# Patient Record
Sex: Female | Born: 2000 | Race: Black or African American | Hispanic: No | Marital: Single | Smoking: Never smoker
Health system: Southern US, Community
[De-identification: ages and names within clinical notes are randomized; demographics above are authoritative.]

## PROBLEM LIST (undated history)

## (undated) ENCOUNTER — Inpatient Hospital Stay: Admission: RE | Payer: Self-pay | Source: Home / Self Care

## (undated) DIAGNOSIS — F1910 Other psychoactive substance abuse, uncomplicated: Secondary | ICD-10-CM

## (undated) DIAGNOSIS — J45909 Unspecified asthma, uncomplicated: Secondary | ICD-10-CM

## (undated) DIAGNOSIS — D571 Sickle-cell disease without crisis: Secondary | ICD-10-CM

## (undated) HISTORY — PX: NO PAST SURGERIES: SHX2092

---
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
## 2018-10-20 IMAGING — US US TRANSVAGINAL NON-OB
1 series · 13 of 25 positions shown · non-contrast
Comparison: None

CLINICAL DATA: 16-year-old female with pain for 2 days. Positive
chlamydia. LMP 06/14/2017.



[Series 1: us transvaginal non-ob · 0.20mm/px · 13 of 82 slices shown]
[im 1/82]
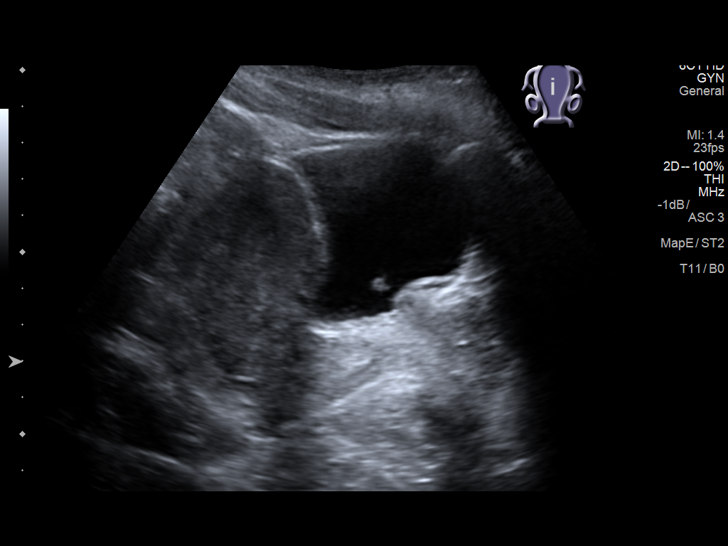
[im 7/82]
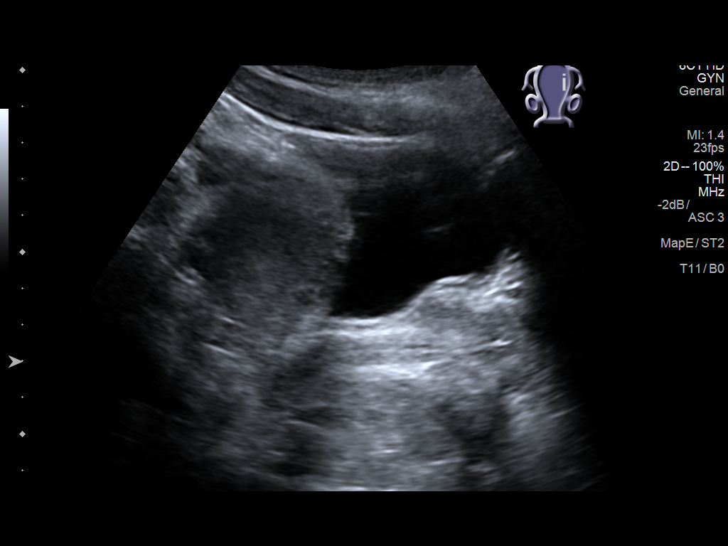
[im 14/82]
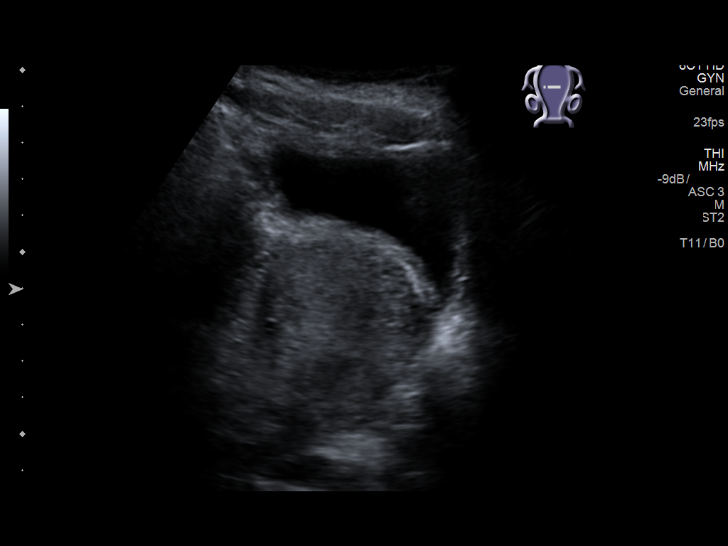
[im 21/82]
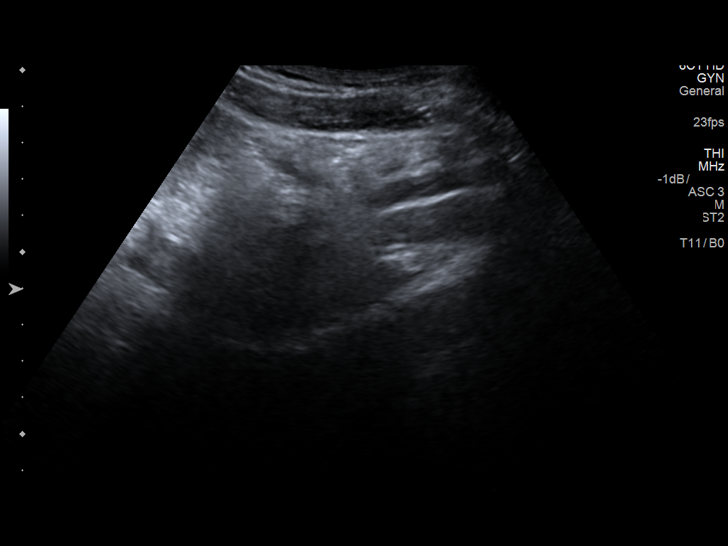
[im 28/82]
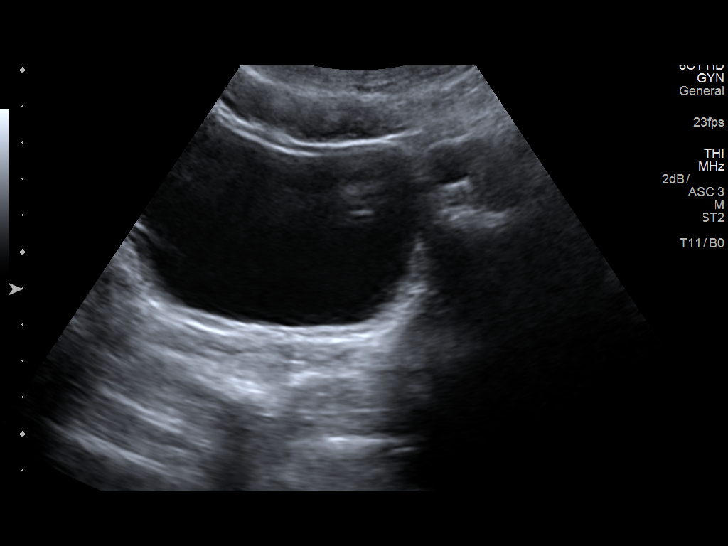
[im 34/82]
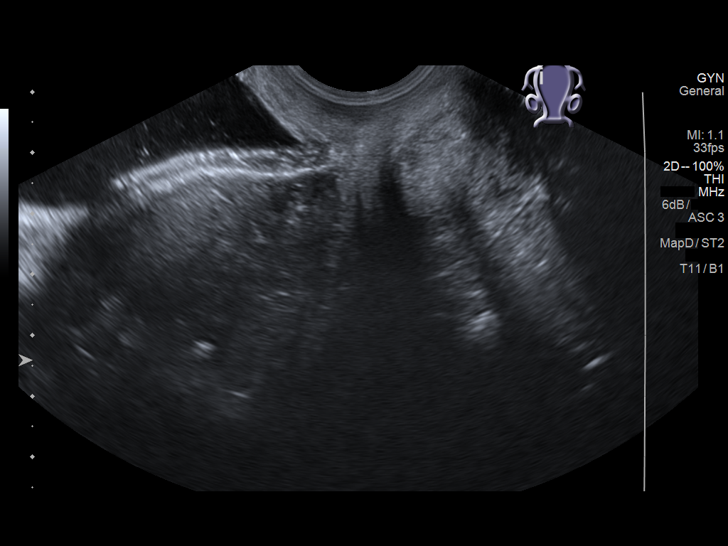
[im 41/82]
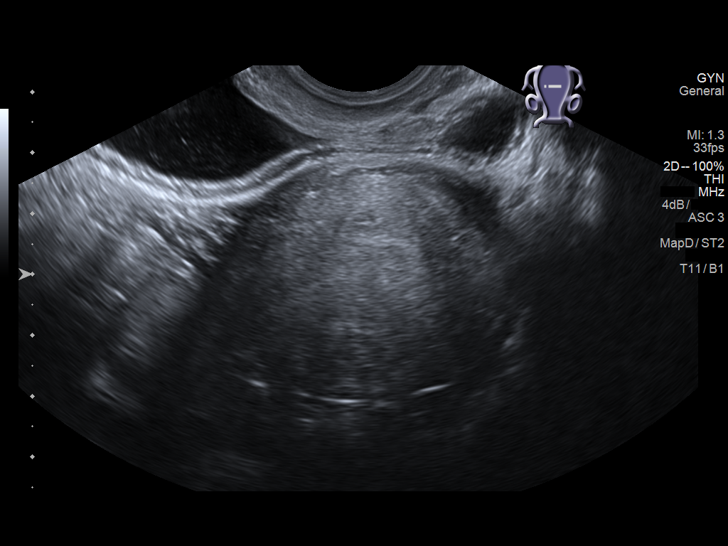
[im 48/82]
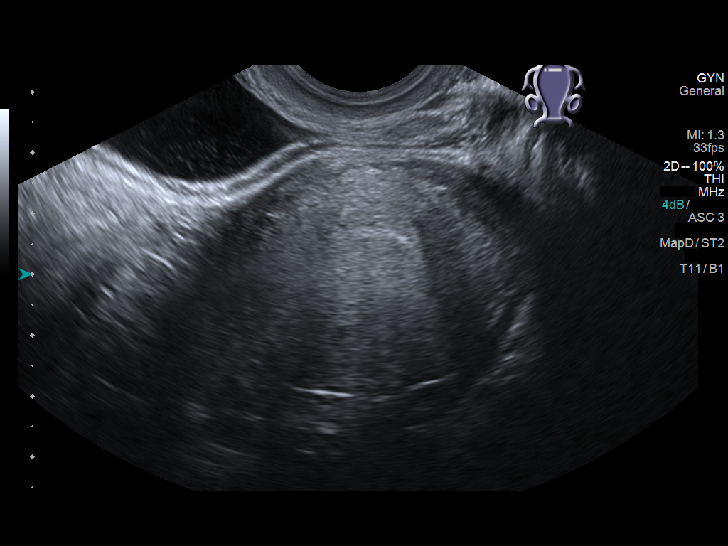
[im 55/82]
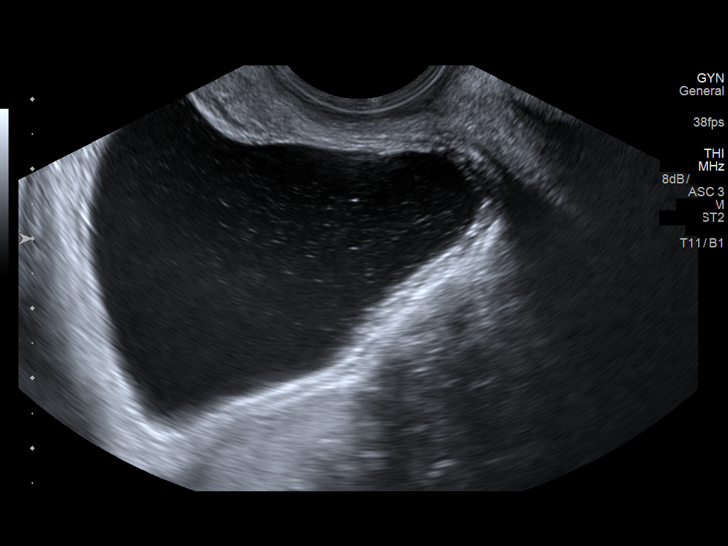
[im 61/82]
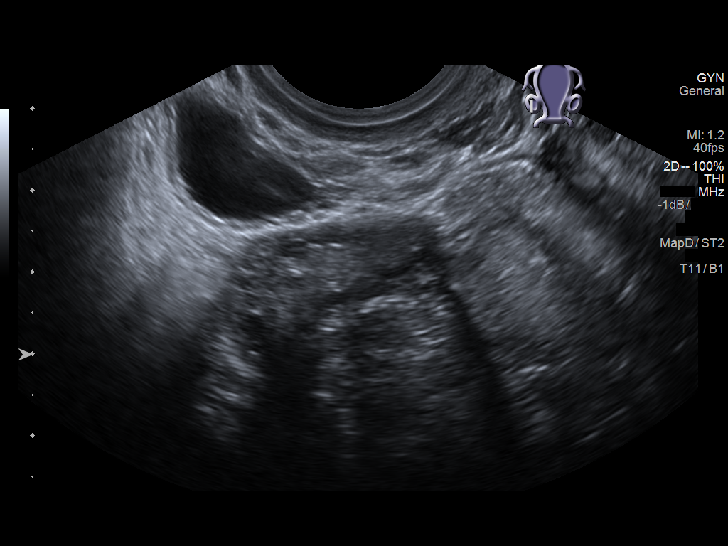
[im 68/82]
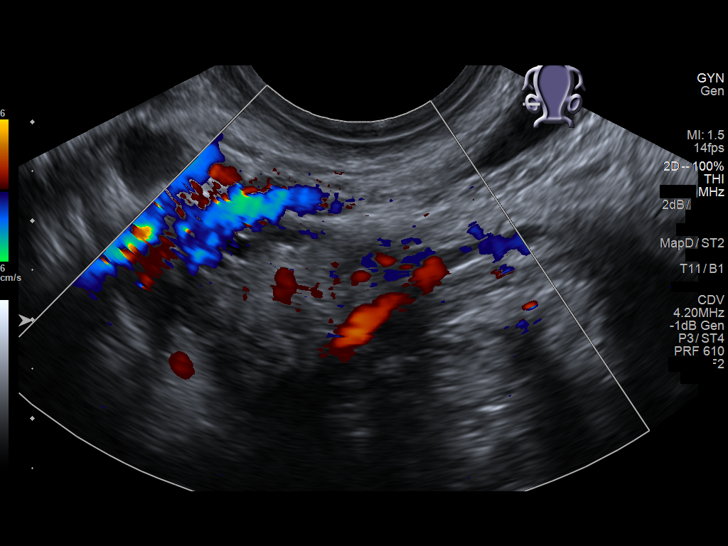
[im 75/82]
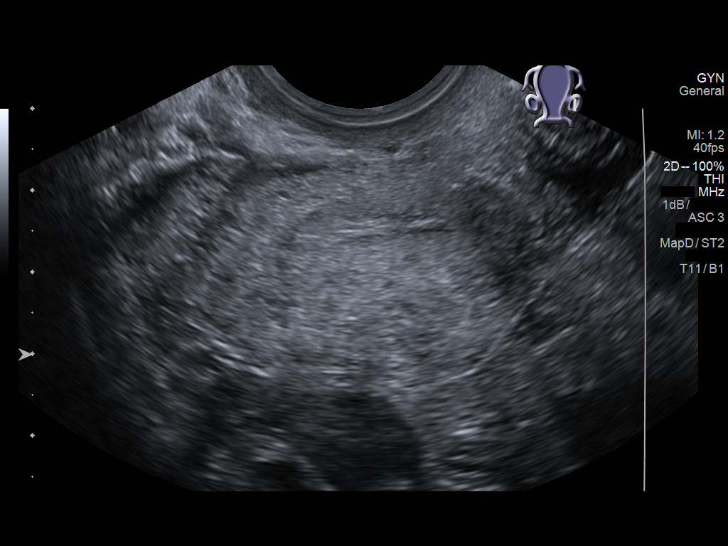
[im 82/82]
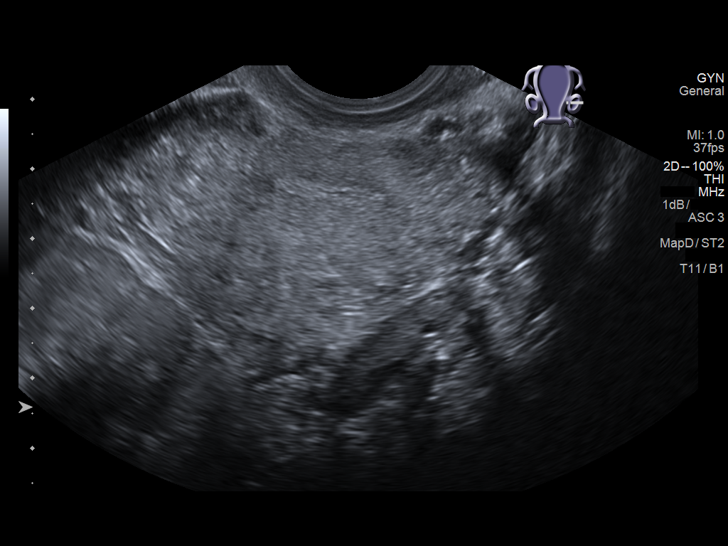

[13 of 25 positions shown; findings below may reference images not displayed]

FINDINGS: Uterus

Measurements: 7.6 x 3.8 x 5.0 centimeters. No fibroids or other mass
visualized.

Endometrium

Thickness: 7 millimeters.  No focal abnormality visualized.

Right ovary

Measurements: 2.9 x 1.8 x 2.3 centimeters. Normal appearance/no
adnexal mass.

Left ovary

Measurements: 2.7 x 1.2 x 1.6 centimeters. Normal appearance/no
adnexal mass.

Other findings

Echogenic urinary debris within the urinary bladder (image 66).

Trace simple appearing pelvic free fluid in the cul-de-sac (image
52).
IMPRESSION: 1. Negative for tubo-ovarian abscess.  Normal uterus and ovaries.
2. Echogenic debris within the urinary bladder.
3. Trace free fluid in the cul-de-sac is likely physiologic.

## 2018-12-13 IMAGING — US US PELVIS COMPLETE TRANSABD/TRANSVAG
1 series · 14 of 25 positions shown · non-contrast
Comparison: None

CLINICAL DATA: Initial evaluation for acute pelvic pain with
vaginal bleeding for 1 day.



[Series 1: us pelvis complete transabd/transvag · 0.17mm/px · 14 of 78 slices shown]
[im 1/78]
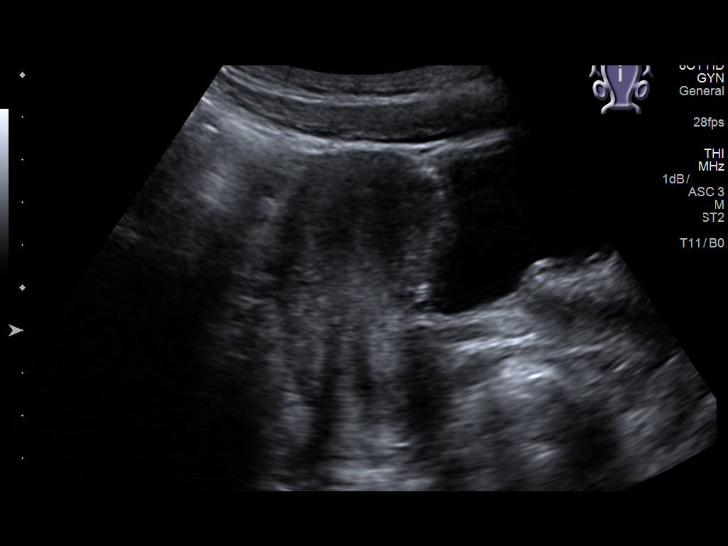
[im 7/78]
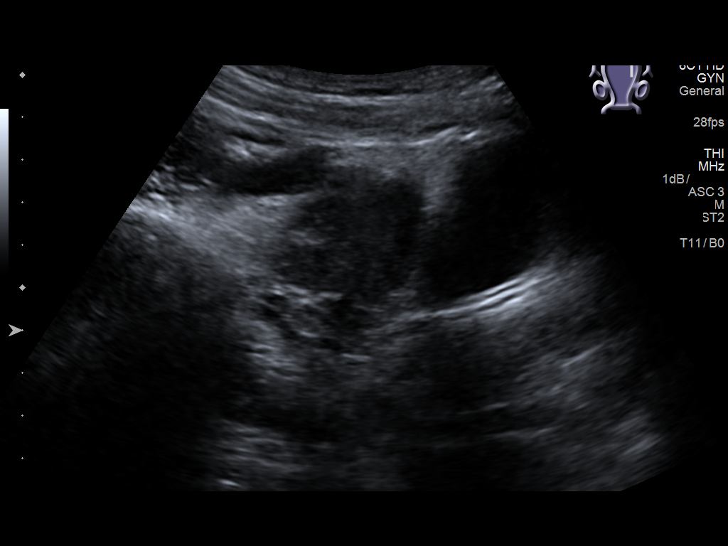
[im 13/78]
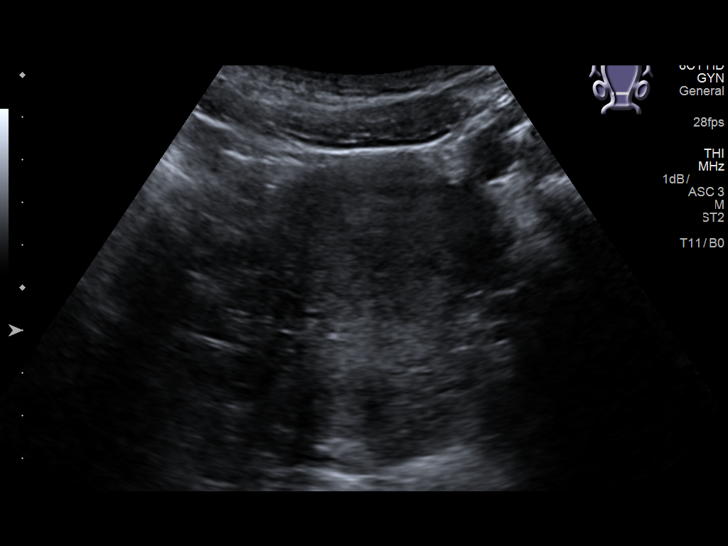
[im 20/78]
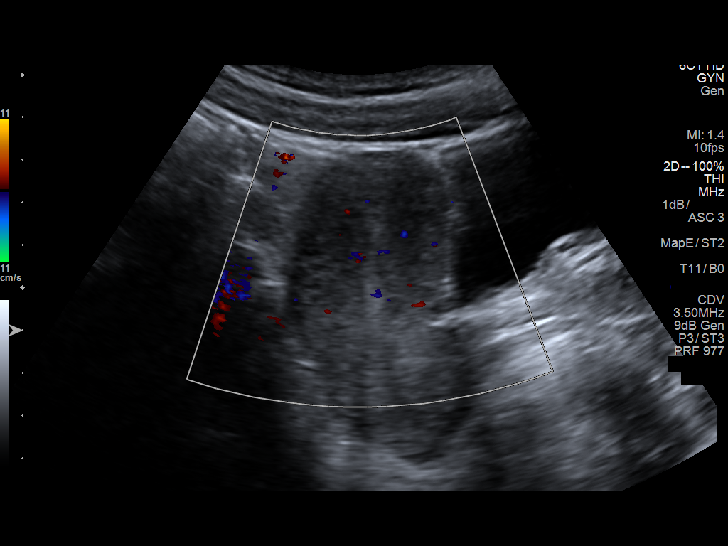
[im 26/78]
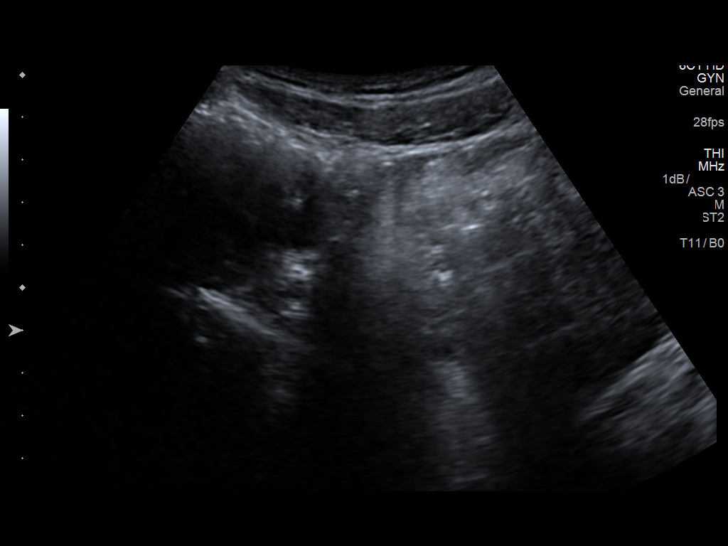
[im 29/78]
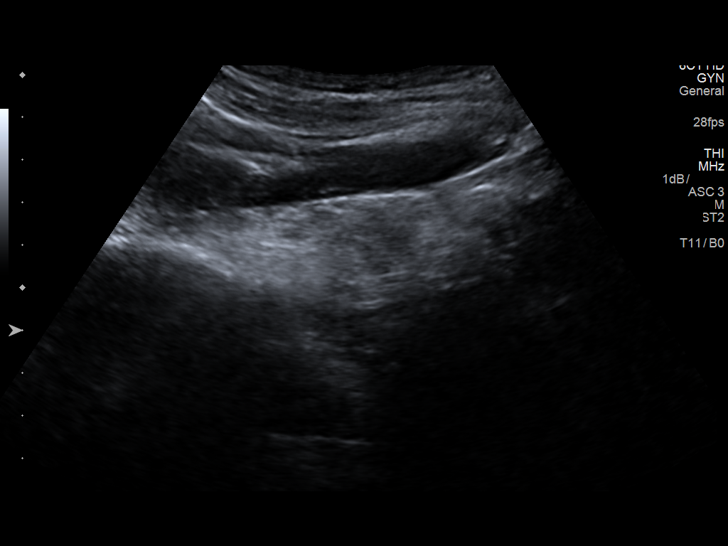
[im 36/78]
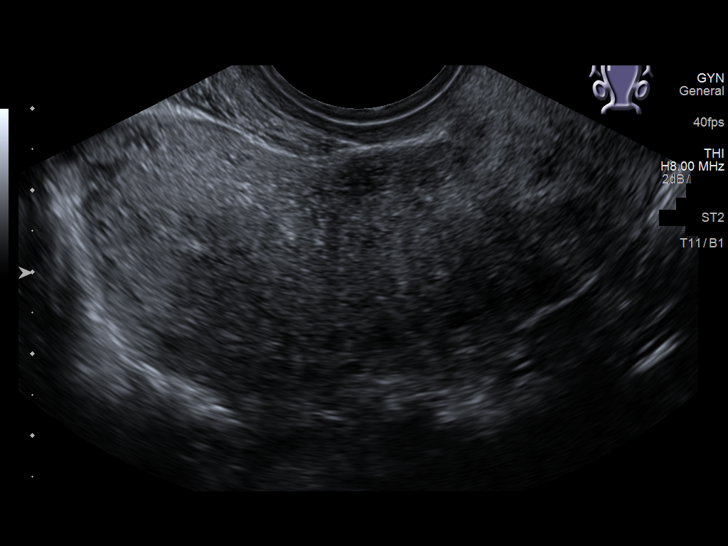
[im 42/78]
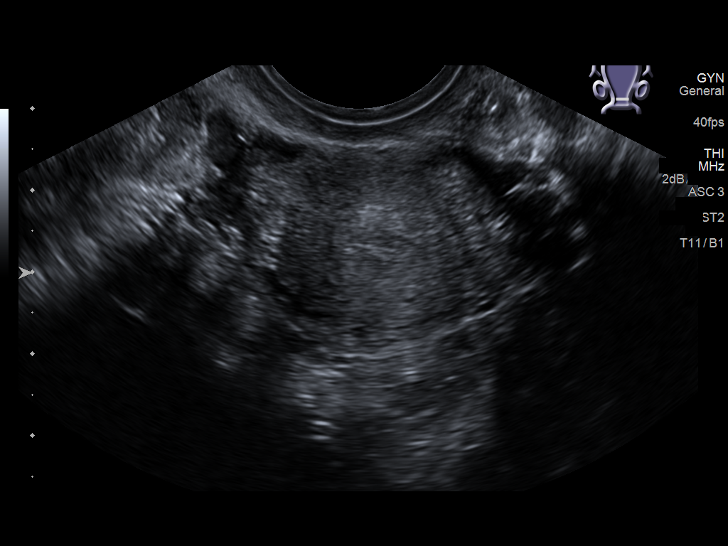
[im 49/78]
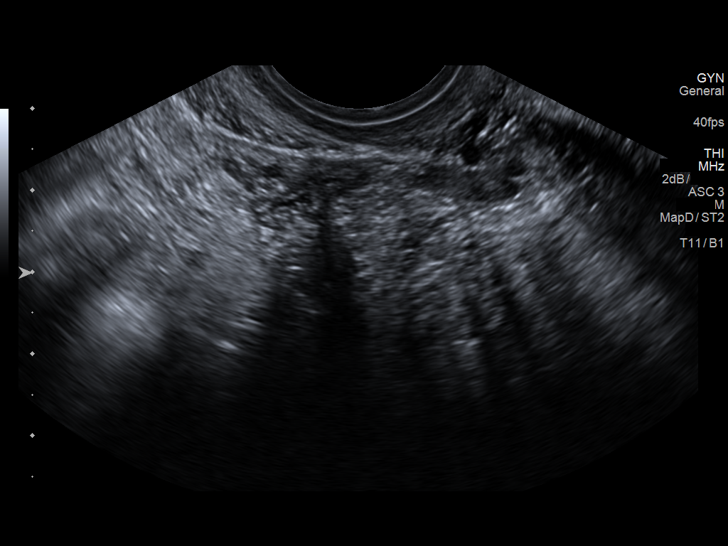
[im 52/78]
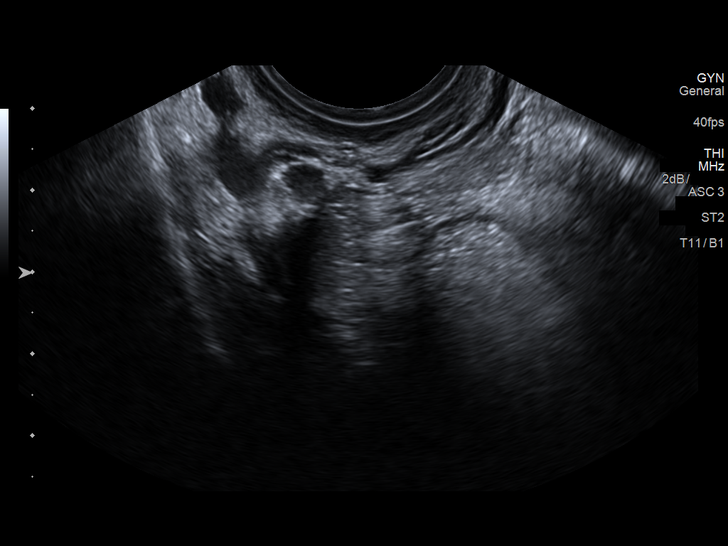
[im 58/78]
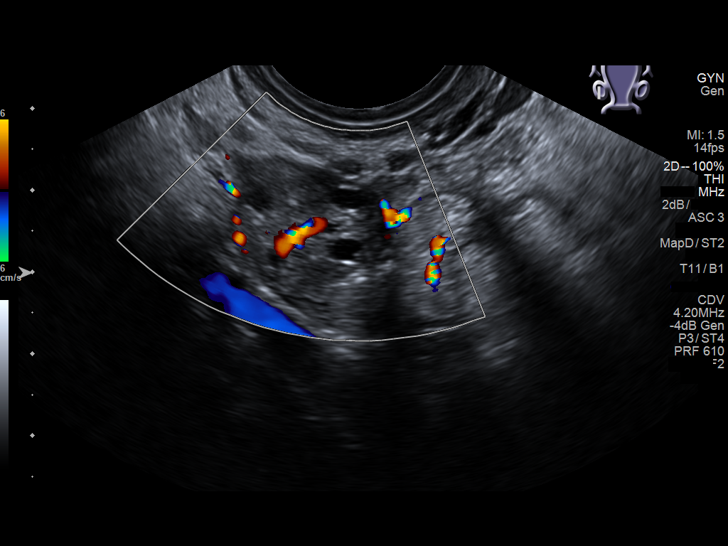
[im 65/78]
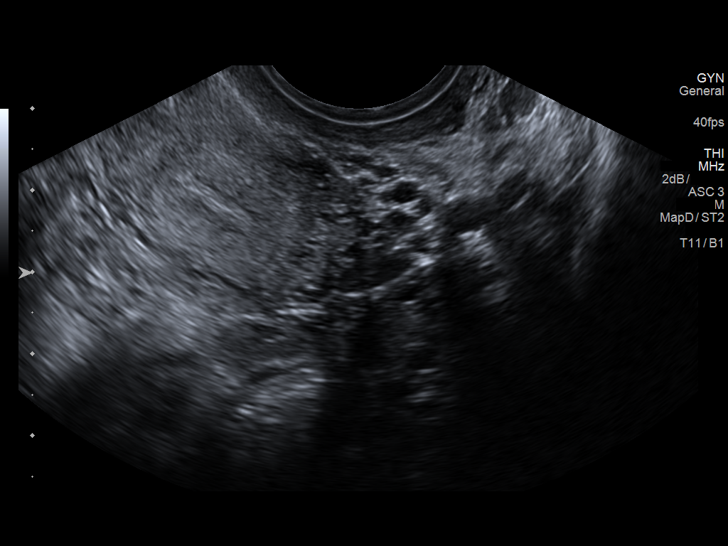
[im 71/78]
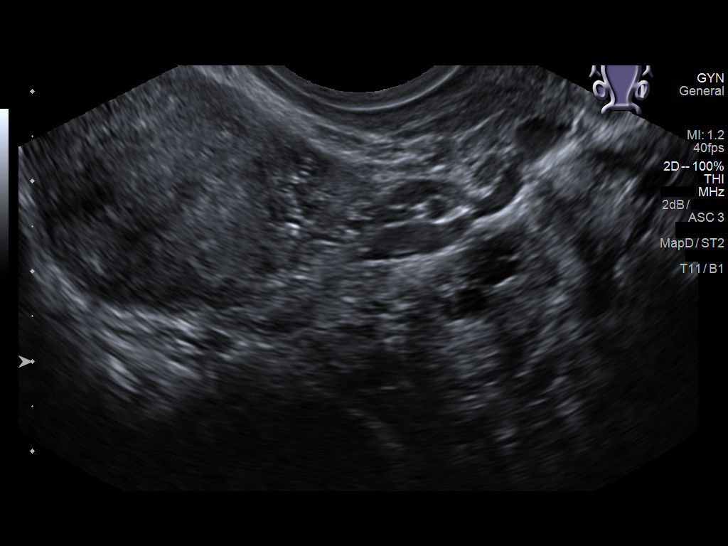
[im 78/78]
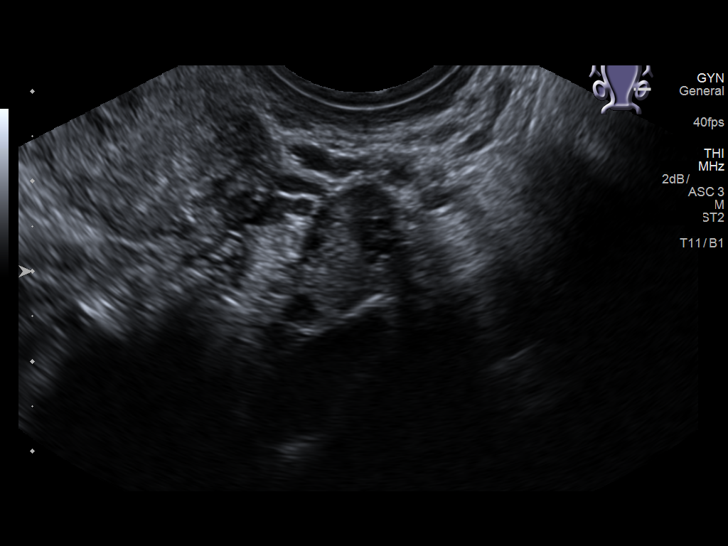

[14 of 25 positions shown; findings below may reference images not displayed]

FINDINGS: Uterus

Measurements: 7.4 x 3.6 x 4.6 cm. No fibroids or other mass
visualized.

Endometrium

Thickness: 4.4 mm.  No focal abnormality visualized.

Right ovary

Measurements: 2.6 x 1.5 x 2.1 cm. Normal appearance/no adnexal mass.

Left ovary

Measurements: 3.2 x 1.6 x 1.6 cm. Normal appearance/no adnexal mass.

Other findings

No abnormal free fluid.
IMPRESSION: Normal pelvic ultrasound.  No acute abnormality identified.

## 2020-08-14 ENCOUNTER — Other Ambulatory Visit: Payer: Self-pay

## 2020-08-14 ENCOUNTER — Encounter: Payer: Self-pay | Admitting: Emergency Medicine

## 2020-08-14 ENCOUNTER — Emergency Department
Admission: EM | Admit: 2020-08-14 | Discharge: 2020-08-14 | Disposition: A | Discharge status: Home / Self Care | Payer: Medicaid Other | Attending: Emergency Medicine | Admitting: Emergency Medicine

## 2020-08-14 DIAGNOSIS — J45909 Unspecified asthma, uncomplicated: Secondary | ICD-10-CM | POA: Insufficient documentation

## 2020-08-14 DIAGNOSIS — B2790 Infectious mononucleosis, unspecified without complication: Secondary | ICD-10-CM

## 2020-08-14 LAB — CBC WITH DIFFERENTIAL/PLATELET
Abs Immature Granulocytes: 0.05 10*3/uL (ref 0.00–0.07)
Basophils Absolute: 0.1 10*3/uL (ref 0.0–0.1)
Basophils Relative: 1 %
Eosinophils Absolute: 0 10*3/uL (ref 0.0–0.5)
Eosinophils Relative: 0 %
HCT: 36.5 % (ref 36.0–46.0)
Hemoglobin: 11.4 g/dL — ABNORMAL LOW (ref 12.0–15.0)
Immature Granulocytes: 0 %
Lymphocytes Relative: 34 %
Lymphs Abs: 4 10*3/uL (ref 0.7–4.0)
MCH: 23.2 pg — ABNORMAL LOW (ref 26.0–34.0)
MCHC: 31.2 g/dL (ref 30.0–36.0)
MCV: 74.3 fL — ABNORMAL LOW (ref 80.0–100.0)
Monocytes Absolute: 1.3 10*3/uL — ABNORMAL HIGH (ref 0.1–1.0)
Monocytes Relative: 11 %
Neutro Abs: 6.5 10*3/uL (ref 1.7–7.7)
Neutrophils Relative %: 54 %
Platelets: 281 10*3/uL (ref 150–400)
RBC Morphology: NORMAL
RBC: 4.91 MIL/uL (ref 3.87–5.11)
RDW: 14.9 % (ref 11.5–15.5)
Smear Review: NORMAL
WBC: 12 10*3/uL — ABNORMAL HIGH (ref 4.0–10.5)
nRBC: 0 % (ref 0.0–0.2)

## 2020-08-14 LAB — GROUP A STREP BY PCR: Group A Strep by PCR: NOT DETECTED

## 2020-08-14 LAB — MONONUCLEOSIS SCREEN: Mono Screen: POSITIVE — AB

## 2020-08-14 MED ORDER — LIDOCAINE VISCOUS HCL 2 % MT SOLN
15.0000 mL | Freq: Once | OROMUCOSAL | Status: AC
  Administered 2020-08-14: 15 mL via OROMUCOSAL
  Filled 2020-08-14: qty 15

## 2020-08-14 MED ORDER — LIDOCAINE VISCOUS HCL 2 % MT SOLN: 15.0000 mL | Freq: Three times a day (TID) | OROMUCOSAL | 0 refills | Status: DC

## 2020-08-14 NOTE — Discharge Instructions (Addendum)
Follow-up with your primary care provider or the acute care at [facility] if any continued problems or concerns.  Return to the emergency department if any severe worsening of your symptoms or urgent concerns over the weekend.  You may take Tylenol or ibuprofen as needed for throat pain however prescription for viscous lidocaine was sent to the pharmacy.  This is to be taken before meals and at bedtime as needed for throat pain.  Continue to drink lots of fluids to stay hydrated.  Also popsicles, Jell-O, applesauce, ice cream or yogurt can also help with your throat pain.  Read the information about mononucleosis.  Avoid sports or any activity that could result and getting hit in the stomach.  This is because with the mononucleosis you can have enlargement of the spleen which could result in injury should you receive a blow to the abdomen.  Get plenty of rest, eat on a regular basis or try protein shakes if eating food is still difficult using the viscous lidocaine.

## 2020-08-14 NOTE — ED Notes (Signed)
See triage note  Presents with sore throat and low grade temp for 3 days

## 2020-08-14 NOTE — ED Provider Notes (Signed)
[facility] Emergency Department Provider Note  ____________________________________________   Event Date/Time   First MD Initiated Contact with Patient 08/14/20 1238     (approximate)  I have reviewed the triage vital signs and the nursing notes.   HISTORY  Chief Complaint Sore Throat and Fever   HPI Tracey Chavez is a 20 y.o. female presents to the ED with complaint of sore throat.  Patient states that her throat has been hurting off and on for approximately 1 week but last evening began having a fever.  She states that she continues to drink and eat is normal.  There is when she is swallowing that she has more pain.  She denies any upper respiratory symptoms, nausea, vomiting, cough or COVID-like symptoms.  She rates her pain as an 8 out of 10.       Past Medical History:  Diagnosis Date   Asthma    Sickle cell anemia (HCC)     Patient Active Problem List   Diagnosis Date Noted   MDD (major depressive disorder), severe (HCC) 07/21/2017   Overdose of nonsteroidal anti-inflammatory drug (NSAID) 07/20/2017   Severe major depression, single episode, without psychotic features (HCC) 07/20/2017   Cannabis abuse 07/20/2017    History reviewed. No pertinent surgical history.  Prior to Admission medications   Medication Sig Start Date End Date Taking? Authorizing Provider  lidocaine (XYLOCAINE) 2 % solution Use as directed 15 mLs in the mouth or throat 4 (four) times daily -  before meals and at bedtime. 08/14/20  Yes Tommi Rumps, PA-C  FLUoxetine (PROZAC) 20 MG capsule Take 1 capsule (20 mg total) by mouth daily. 07/26/17   Leata Mouse, MD  norgestimate-ethinyl estradiol (ORTHO-CYCLEN,SPRINTEC,PREVIFEM) 0.25-35 MG-MCG tablet Take 1 tablet by mouth daily.    [provider]  traZODone (DESYREL) 50 MG tablet Take 1 tablet (50 mg total) by mouth at bedtime as needed for sleep. 07/25/17   Leata Mouse, MD     Allergies Patient has no known allergies.  No family history on file.  Social History Social History   Tobacco Use   Smoking status: Never   Smokeless tobacco: Never  Vaping Use   Vaping Use: Never used  Substance Use Topics   Alcohol use: Not Currently    Comment: Has drank with friends in past. Denies currently.   Drug use: Never    Review of Systems Constitutional: No fever/chills Eyes: No visual changes. ENT: Positive sore throat. Cardiovascular: Denies chest pain. Respiratory: Denies shortness of breath.  Negative for cough. Gastrointestinal: No abdominal pain.  No nausea, no vomiting.  No diarrhea.   Genitourinary: Negative for dysuria. Musculoskeletal: Negative for muscle skeletal pain. Skin: Negative for rash. Neurological: Negative for headaches, focal weakness or numbness. ____________________________________________   PHYSICAL EXAM:  VITAL SIGNS: ED Triage Vitals  Enc Vitals Group     BP 08/14/20 1223 109/86     Pulse Rate 08/14/20 1223 96     Resp 08/14/20 1223 18     Temp 08/14/20 1223 99.4 F (37.4 C)     Temp Source 08/14/20 1223 Oral     SpO2 08/14/20 1223 100 %     Weight 08/14/20 1158 120 lb (54.4 kg)     Height 08/14/20 1158 5\' 1"  (1.549 m)     Head Circumference --      Peak Flow --      Pain Score 08/14/20 1158 8     Pain Loc --  Pain Edu? --      Excl. in GC? --     Constitutional: Alert and oriented. Well appearing and in no acute distress. Eyes: Conjunctivae are normal. PERRL. EOMI. Head: Atraumatic. Nose: No congestion/rhinnorhea. Mouth/Throat: Mucous membranes are moist.  Oropharynx mild erythema with tonsillar exudate bilaterally.  Uvula is midline. Neck: No stridor.   Hematological/Lymphatic/Immunilogical: Positive bilateral tender cervical lymphadenopathy. Cardiovascular: Normal rate, regular rhythm. Grossly normal heart sounds.  Good peripheral circulation. Respiratory: Normal respiratory effort.  No retractions.  Lungs CTAB. Gastrointestinal: Soft and nontender. No distention.  Musculoskeletal: Moves upper and lower extremities without any difficulty.  Normal gait was noted. Neurologic:  Normal speech and language. No gross focal neurologic deficits are appreciated. No gait instability. Skin:  Skin is warm, dry and intact. No rash noted. Psychiatric: Mood and affect are normal. Speech and behavior are normal.  ____________________________________________   LABS (all labs ordered are listed, but only abnormal results are displayed)  Labs Reviewed  CBC WITH DIFFERENTIAL/PLATELET - Abnormal; Notable for the following components:      Result Value   WBC 12.0 (*)    Hemoglobin 11.4 (*)    MCV 74.3 (*)    MCH 23.2 (*)    Monocytes Absolute 1.3 (*)    All other components within normal limits  MONONUCLEOSIS SCREEN - Abnormal; Notable for the following components:   Mono Screen POSITIVE (*)    All other components within normal limits  GROUP A STREP BY PCR     PROCEDURES  Procedure(s) performed (including Critical Care):  Procedures   ____________________________________________   INITIAL IMPRESSION / ASSESSMENT AND PLAN / ED COURSE  As part of my medical decision making, I reviewed the following data within the electronic MEDICAL RECORD NUMBER Notes from prior ED visits and [place] Controlled Substance Database  20 year old female presents to the ED with complaint of sore throat that has been hurting off and on for approximately 1 week.  She states that last evening she began running fever.  She complains of pain with swallowing.  On exam there is bilateral tonsillar exudate with bilateral lymphadenopathy.  Test was positive for mononucleosis and patient was made aware.  A trial of viscous lidocaine solution was tried and a prescription for the same was sent to the pharmacy for before meals and at bedtime.  She is encouraged to drink fluids and stay hydrated and also possibly looking into protein  shakes.  Tylenol as needed for discomfort.  She is to follow-up with her PCP or urgent care if any continued problems.  She is to return to the emergency department over the weekend if any severe worsening of her symptoms or urgent concerns.  We also discussed no sports activities or any activity that could result in getting hit in the stomach.   ____________________________________________   FINAL CLINICAL IMPRESSION(S) / ED DIAGNOSES  Final diagnoses:  Infectious mononucleosis without complication, infectious mononucleosis due to unspecified organism     ED Discharge Orders          Ordered    lidocaine (XYLOCAINE) 2 % solution  3 times daily before meals & bedtime        08/14/20 1450             Note:  This document was prepared using Dragon voice recognition software and may include unintentional dictation errors.    Tommi Rumps, PA-C 08/14/20 1503    Delton Prairie, MD 08/14/20 [phone]

## 2020-10-07 ENCOUNTER — Other Ambulatory Visit: Payer: Self-pay

## 2020-10-07 ENCOUNTER — Ambulatory Visit: Payer: Self-pay

## 2020-10-12 ENCOUNTER — Ambulatory Visit: Payer: Self-pay

## 2022-08-25 ENCOUNTER — Ambulatory Visit (LOCAL_COMMUNITY_HEALTH_CENTER): Payer: Self-pay

## 2022-08-25 VITALS — BP 109/67 | Ht 61.0 in | Wt 112.5 lb

## 2022-08-25 DIAGNOSIS — Z3009 Encounter for other general counseling and advice on contraception: Secondary | ICD-10-CM

## 2022-08-25 DIAGNOSIS — Z3201 Encounter for pregnancy test, result positive: Secondary | ICD-10-CM

## 2022-08-25 MED ORDER — PRENATAL 27-0.8 MG PO TABS: 1.0000 | ORAL_TABLET | Freq: Every day | ORAL | Status: AC

## 2022-08-25 NOTE — Progress Notes (Signed)
UPT positive. Plans prenatal care at  [facility]. Positive preg packet given. LMP 05/11/2022, approx date, pt unsure of exact date.   Hx mental health problems. Hospitalized 2019. States she is better now and stable. Denies thoughts of depression.  Per patient, Mother supportive of preg.   The patient was dispensed prenatal vitamins #100 today per order by Dr Ralene Bathe. I provided counseling today regarding the medication. We discussed the medication, the side effects and when to call clinic. Patient given the opportunity to ask questions. Questions answered.    Sent to clerk for preadmit. Jerel Shepherd, RN

## 2023-01-04 ENCOUNTER — Other Ambulatory Visit: Payer: Self-pay

## 2023-01-04 ENCOUNTER — Observation Stay
Admission: EM | Admit: 2023-01-04 | Discharge: 2023-01-05 | Disposition: A | Discharge status: Home / Self Care | Payer: Self-pay | Attending: Obstetrics and Gynecology | Admitting: Obstetrics and Gynecology

## 2023-01-04 DIAGNOSIS — Z3A33 33 weeks gestation of pregnancy: Secondary | ICD-10-CM | POA: Insufficient documentation

## 2023-01-04 DIAGNOSIS — J45909 Unspecified asthma, uncomplicated: Secondary | ICD-10-CM | POA: Insufficient documentation

## 2023-01-04 DIAGNOSIS — O1203 Gestational edema, third trimester: Principal | ICD-10-CM | POA: Insufficient documentation

## 2023-01-04 DIAGNOSIS — O99513 Diseases of the respiratory system complicating pregnancy, third trimester: Secondary | ICD-10-CM | POA: Insufficient documentation

## 2023-01-04 DIAGNOSIS — O0933 Supervision of pregnancy with insufficient antenatal care, third trimester: Secondary | ICD-10-CM

## 2023-01-04 DIAGNOSIS — O1200 Gestational edema, unspecified trimester: Principal | ICD-10-CM | POA: Diagnosis present

## 2023-01-05 ENCOUNTER — Observation Stay: Payer: Self-pay

## 2023-01-05 DIAGNOSIS — O0933 Supervision of pregnancy with insufficient antenatal care, third trimester: Secondary | ICD-10-CM

## 2023-01-05 DIAGNOSIS — O99513 Diseases of the respiratory system complicating pregnancy, third trimester: Secondary | ICD-10-CM | POA: Diagnosis not present

## 2023-01-05 DIAGNOSIS — Z3A33 33 weeks gestation of pregnancy: Secondary | ICD-10-CM | POA: Diagnosis not present

## 2023-01-05 DIAGNOSIS — O1200 Gestational edema, unspecified trimester: Principal | ICD-10-CM | POA: Diagnosis present

## 2023-01-05 DIAGNOSIS — O1203 Gestational edema, third trimester: Secondary | ICD-10-CM | POA: Diagnosis present

## 2023-01-05 DIAGNOSIS — J45909 Unspecified asthma, uncomplicated: Secondary | ICD-10-CM | POA: Diagnosis not present

## 2023-01-05 LAB — TYPE AND SCREEN
ABO/RH(D): A POS
Antibody Screen: NEGATIVE

## 2023-01-05 LAB — DIFFERENTIAL
Abs Immature Granulocytes: 0.04 10*3/uL (ref 0.00–0.07)
Basophils Absolute: 0 10*3/uL (ref 0.0–0.1)
Basophils Relative: 0 %
Eosinophils Absolute: 0.1 10*3/uL (ref 0.0–0.5)
Eosinophils Relative: 1 %
Immature Granulocytes: 0 %
Lymphocytes Relative: 22 %
Lymphs Abs: 2.3 10*3/uL (ref 0.7–4.0)
Monocytes Absolute: 0.8 10*3/uL (ref 0.1–1.0)
Monocytes Relative: 8 %
Neutro Abs: 6.8 10*3/uL (ref 1.7–7.7)
Neutrophils Relative %: 69 %

## 2023-01-05 LAB — HEPATITIS B SURFACE ANTIGEN: Hepatitis B Surface Ag: NONREACTIVE

## 2023-01-05 LAB — CBC
HCT: 28.6 % — ABNORMAL LOW (ref 36.0–46.0)
Hemoglobin: 8.9 g/dL — ABNORMAL LOW (ref 12.0–15.0)
MCH: 23.4 pg — ABNORMAL LOW (ref 26.0–34.0)
MCHC: 31.1 g/dL (ref 30.0–36.0)
MCV: 75.3 fL — ABNORMAL LOW (ref 80.0–100.0)
Platelets: 254 10*3/uL (ref 150–400)
RBC: 3.8 MIL/uL — ABNORMAL LOW (ref 3.87–5.11)
RDW: 14.4 % (ref 11.5–15.5)
WBC: 10.1 10*3/uL (ref 4.0–10.5)
nRBC: 0 % (ref 0.0–0.2)

## 2023-01-05 LAB — PROTEIN / CREATININE RATIO, URINE
Creatinine, Urine: 185 mg/dL
Protein Creatinine Ratio: 0.06 mg/mg{creat} (ref 0.00–0.15)
Total Protein, Urine: 12 mg/dL

## 2023-01-05 LAB — CHLAMYDIA/NGC RT PCR (ARMC ONLY)
Chlamydia Tr: NOT DETECTED
N gonorrhoeae: NOT DETECTED

## 2023-01-05 LAB — RAPID HIV SCREEN (HIV 1/2 AB+AG)
HIV 1/2 Antibodies: NONREACTIVE
HIV-1 P24 Antigen - HIV24: NONREACTIVE

## 2023-01-05 LAB — URINE DRUG SCREEN, QUALITATIVE (ARMC ONLY)
Amphetamines, Ur Screen: NOT DETECTED
Barbiturates, Ur Screen: NOT DETECTED
Benzodiazepine, Ur Scrn: NOT DETECTED
Cannabinoid 50 Ng, Ur ~~LOC~~: NOT DETECTED
Cocaine Metabolite,Ur ~~LOC~~: NOT DETECTED
MDMA (Ecstasy)Ur Screen: NOT DETECTED
Methadone Scn, Ur: NOT DETECTED
Opiate, Ur Screen: NOT DETECTED
Phencyclidine (PCP) Ur S: NOT DETECTED
Tricyclic, Ur Screen: NOT DETECTED

## 2023-01-05 LAB — RPR: RPR Ser Ql: NONREACTIVE

## 2023-01-05 MED ORDER — CALCIUM CARBONATE ANTACID 500 MG PO CHEW: 2.0000 | CHEWABLE_TABLET | ORAL | Status: DC | PRN

## 2023-01-05 MED ORDER — ACETAMINOPHEN 500 MG PO TABS: 1000.0000 mg | ORAL_TABLET | Freq: Four times a day (QID) | ORAL | Status: DC | PRN

## 2023-01-05 NOTE — OB Triage Note (Signed)
Patient is a 22 yo, G2P0, at 33 weeks and 1 day. Patient presents with complaints of lower extremity swelling.  Patient denies any vaginal bleeding or LOF. Patient reports +FM. Monitors applied and assessing. Initial fetal heart tone is 125. Lucile Crater CNM notified and aware of patients arrival to unit.

## 2023-01-05 NOTE — Discharge Summary (Signed)
Tracey Chavez is a 22 y.o. female. She is at [redacted]w[redacted]d gestation. Patient's last menstrual period was 05/17/2022 (exact date). 02/21/2023, by Last Menstrual Period   Prenatal care site:  No established care   Chief complaint: swelling in ankles and feet   Admission Diagnoses:  1) intrauterine pregnancy at [redacted]w[redacted]d  2) Edema during pregnancy [O12.00]  Discharge Diagnoses:  Principal Problem:   Edema during pregnancy Active Problems:   No prenatal care in current pregnancy in third trimester   HPI: Tracey Chavez presents to L&D with complaints of swelling in her ankles and feet. It has gotten progressively worse over the past couple of days. She is concerned about preeclampsia.  Her pregnancy is complicated by no prenatal care in this pregnancy . She has not yet established with a prenatal provider and has not had an ultrasound done. States her menstrual cycles were regular prior to pregnancy and she was not on any contraception. This was not a planned pregnancy but is a desired one.  She denies Contractions, Loss of fluid, or Vaginal bleeding. Endorses fetal movement as active. Denies HA, changes in vision, or RUQ pain.   S: Resting comfortably. no CTX, no VB.no LOF,  Active fetal movement.   Maternal Medical History:  Past Medical Hx:  has a past medical history of Asthma, Mental disorder (2019), and Sickle cell anemia (HCC).    Past Surgical Hx:  has a past surgical history that includes No past surgeries.   No Known Allergies   Prior to Admission medications   Medication Sig Start Date End Date Taking? Authorizing Provider  Prenatal Vit-Fe Fumarate-FA (PRENATAL MULTIVITAMIN) TABS tablet Take 1 tablet by mouth daily at 12 noon.   Yes [provider]    Social History: She  reports that she has never smoked. She has never used smokeless tobacco. She reports that she does not currently use alcohol. She reports that she does not currently use drugs after having used the following  drugs: Marijuana.  Family History: family history is not on file.   Review of Systems: A full review of systems was performed and negative except as noted in the HPI.     Pertinent Results:  Prenatal Labs: Blood type/Rh A POS   Antibody screen Negative    Rubella Pending    Varicella Pending   RPR NON REACTIVE (12/05 0040)   HBsAg NON REACTIVE (12/05 0040)  Hep C Pending   HIV NON REACTIVE (12/05 0040)   GC neg  Chlamydia neg  Genetic screening Not done   1 hour GTT Not done   3 hour GTT N/A  GBS Unknown       O:  BP 118/79   Pulse 78   Temp 98.5 F (36.9 C) (Oral)   Resp 18   LMP 05/17/2022 (Exact Date)  Results for orders placed or performed during the hospital encounter of 01/04/23 (from the past 48 hour(s))  Hepatitis B surface antigen   Collection Time: 01/05/23 12:40 AM  Result Value Ref Range   Hepatitis B Surface Ag NON REACTIVE NON REACTIVE  RPR   Collection Time: 01/05/23 12:40 AM  Result Value Ref Range   RPR Ser Ql NON REACTIVE NON REACTIVE  CBC   Collection Time: 01/05/23 12:40 AM  Result Value Ref Range   WBC 10.1 4.0 - 10.5 K/uL   RBC 3.80 (L) 3.87 - 5.11 MIL/uL   Hemoglobin 8.9 (L) 12.0 - 15.0 g/dL   HCT 69.6 (L) 29.5 - 28.4 %  MCV 75.3 (L) 80.0 - 100.0 fL   MCH 23.4 (L) 26.0 - 34.0 pg   MCHC 31.1 30.0 - 36.0 g/dL   RDW 16.1 09.6 - 04.5 %   Platelets 254 150 - 400 K/uL   nRBC 0.0 0.0 - 0.2 %  Differential   Collection Time: 01/05/23 12:40 AM  Result Value Ref Range   Neutrophils Relative % 69 %   Neutro Abs 6.8 1.7 - 7.7 K/uL   Lymphocytes Relative 22 %   Lymphs Abs 2.3 0.7 - 4.0 K/uL   Monocytes Relative 8 %   Monocytes Absolute 0.8 0.1 - 1.0 K/uL   Eosinophils Relative 1 %   Eosinophils Absolute 0.1 0.0 - 0.5 K/uL   Basophils Relative 0 %   Basophils Absolute 0.0 0.0 - 0.1 K/uL   Immature Granulocytes 0 %   Abs Immature Granulocytes 0.04 0.00 - 0.07 K/uL  Rapid HIV screen (HIV 1/2 Ab+Ag)   Collection Time: 01/05/23 12:40 AM   Result Value Ref Range   HIV-1 P24 Antigen - HIV24 NON REACTIVE NON REACTIVE   HIV 1/2 Antibodies NON REACTIVE NON REACTIVE   Interpretation (HIV Ag Ab)      A non reactive test result means that HIV 1 or HIV 2 antibodies and HIV 1 p24 antigen were not detected in the specimen.  Type and screen [facility]   Collection Time: 01/05/23 12:40 AM  Result Value Ref Range   ABO/RH(D) A POS    Antibody Screen NEG    Sample Expiration      01/08/2023,2359 Performed at [facility], [address] [place], [place] [address]   Chlamydia/NGC rt PCR [facility] only)   Collection Time: 01/05/23 12:48 AM   Specimen: Urine; GU  Result Value Ref Range   Specimen source GC/Chlam URINE, RANDOM    Chlamydia Tr NOT DETECTED NOT DETECTED   N gonorrhoeae NOT DETECTED NOT DETECTED  Urine Drug Screen, Qualitative ([facility] only)   Collection Time: 01/05/23 12:48 AM  Result Value Ref Range   Tricyclic, Ur Screen NONE DETECTED NONE DETECTED   Amphetamines, Ur Screen NONE DETECTED NONE DETECTED   MDMA (Ecstasy)Ur Screen NONE DETECTED NONE DETECTED   Cocaine Metabolite,Ur [place] NONE DETECTED NONE DETECTED   Opiate, Ur Screen NONE DETECTED NONE DETECTED   Phencyclidine (PCP) Ur S NONE DETECTED NONE DETECTED   Cannabinoid 50 Ng, Ur [place] NONE DETECTED NONE DETECTED   Barbiturates, Ur Screen NONE DETECTED NONE DETECTED   Benzodiazepine, Ur Scrn NONE DETECTED NONE DETECTED   Methadone Scn, Ur NONE DETECTED NONE DETECTED  Protein / creatinine ratio, urine   Collection Time: 01/05/23 12:48 AM  Result Value Ref Range   Creatinine, Urine 185 mg/dL   Total Protein, Urine 12 mg/dL   Protein Creatinine Ratio 0.06 0.00 - 0.15 mg/mg[Cre]     Constitutional: NAD, AAOx3  PULM: nl respiratory effort Abd: gravid, non-tender Ext: Non-tender, 1-2+ edema in ankles and feet Psych: mood appropriate, speech normal Pelvic : deferred   NST: Baseline FHR: 130 beats/min Variability:  moderate Accelerations: present Decelerations: absent Tocometry: Occasional  Time: at least 20 minutes   Interpretation: Category I INDICATIONS: rule out uterine contractions RESULTS:  A NST procedure was performed with FHR monitoring and a normal baseline established, appropriate time of 20-40 minutes of evaluation, and accels >2 seen w 15x15 characteristics.  Results show a REACTIVE NST.   Consults: None  Procedures: NST, [facility] [place]   Hospital Course: The patient was admitted to  Labor and Delivery Triage for observation. Blood pressure and preeclampsia labs were normal. Prenatal labs obtained. Limited OB US completed and c/w current dating by LMP.  Discussed the importance of starting prenatal care. Recommend PO iron supplements. Also discussed comfort measures for swelling in pregnancy. Will plan to follow up for NOB and outpatient anatomy US ASAP. NST was reactive.  She was deemed stable for discharge and further outpatient management.   Discharge Condition: stable  Disposition: Discharge disposition: 01-Home or Self Care       Allergies as of 01/05/2023   No Known Allergies      Medication List     STOP taking these medications    FLUoxetine 20 MG capsule Commonly known as: PROZAC   lidocaine 2 % solution Commonly known as: XYLOCAINE   norgestimate-ethinyl estradiol 0.25-35 MG-MCG tablet Commonly known as: ORTHO-CYCLEN   traZODone 50 MG tablet Commonly known as: DESYREL       TAKE these medications    prenatal multivitamin Tabs tablet Take 1 tablet by mouth daily at 12 noon.        Follow-up Information     [facility]. Schedule an appointment as soon as possible for a visit in 1 week(s).   Why: for new OB appointment and anatomy US Contact information: [address] [place] [address] [phone]               ----- Gustavo Lah, CNM  Certified Nurse Midwife [facility]

## 2023-01-05 NOTE — Progress Notes (Addendum)
Discharge instructions provided to patient. Patient verbalized understanding. Pt educated on signs and symptoms of labor, vaginal bleeding, LOF, and fetal movement. Pt has been instructed to schedule ob appointment in one week. Red flag signs reviewed by RN. Patient discharged home with significant other in stable condition.

## 2023-01-06 LAB — HCV AB W REFLEX TO QUANT PCR: HCV Ab: NONREACTIVE

## 2023-01-06 LAB — RUBELLA SCREEN: Rubella: 6.12 {index} (ref >0.99)

## 2023-01-06 LAB — HCV INTERPRETATION

## 2023-01-06 LAB — VARICELLA ZOSTER ANTIBODY, IGG: Varicella IgG: REACTIVE

## 2023-02-01 NOTE — L&D Delivery Note (Signed)
 Delivery Note  Date of delivery: 03/21/2023 Estimated Date of Delivery: 02/21/23 Patient's last menstrual period was 05/17/2022 (exact date). EGA: [redacted]w[redacted]d  Delivery Note At 6:17 PM a viable female was delivered via Vaginal, Spontaneous at home; weight 7 lb 3.3 oz (3270 g).   Placenta status: Spontaneous, Intact.  Cord: 3 vessels with the following complications: none.  Cord pH: n/a  First Stage: Labor onset: unknonw Augmentation : none Analgesia /Anesthesia intrapartum: none AROM at unknown  Georganna Skeans presented to L&D after SVD at home.   Second Stage: Complete dilation at unknown Onset of pushing at unknown FHR second stage unknown Delivery at 1817 on 03/21/2023 per EMS  Cord clamped and cut by EMS.   Third Stage: Placenta delivered intact with 3VC at 1915 Placenta disposition: routine disposal Uterine tone firm / bleeding min  Anesthesia: None Episiotomy: None Lacerations: None Suture Repair: n/a Est. Blood Loss (mL):  100  Complications: none  Mom to postpartum.  Baby to Couplet care / Skin to Skin.    Cyril Mourning, CNM 04/13/2023 8:21 PM

## 2023-03-21 ENCOUNTER — Encounter: Payer: Self-pay | Admitting: Obstetrics and Gynecology

## 2023-03-21 ENCOUNTER — Inpatient Hospital Stay
Admission: EM | Admit: 2023-03-21 | Discharge: 2023-03-24 | DRG: 806 | Disposition: A | Discharge status: Home / Self Care | Payer: MEDICAID | Attending: Obstetrics and Gynecology | Admitting: Obstetrics and Gynecology

## 2023-03-21 ENCOUNTER — Other Ambulatory Visit: Payer: Self-pay

## 2023-03-21 DIAGNOSIS — O1414 Severe pre-eclampsia complicating childbirth: Secondary | ICD-10-CM | POA: Diagnosis present

## 2023-03-21 DIAGNOSIS — Z3A49 Greater than 42 weeks gestation of pregnancy: Secondary | ICD-10-CM | POA: Diagnosis not present

## 2023-03-21 DIAGNOSIS — D62 Acute posthemorrhagic anemia: Secondary | ICD-10-CM | POA: Diagnosis not present

## 2023-03-21 DIAGNOSIS — O9081 Anemia of the puerperium: Secondary | ICD-10-CM | POA: Diagnosis not present

## 2023-03-21 LAB — URINE DRUG SCREEN, QUALITATIVE (ARMC ONLY)
Amphetamines, Ur Screen: NOT DETECTED
Barbiturates, Ur Screen: NOT DETECTED
Benzodiazepine, Ur Scrn: NOT DETECTED
Cannabinoid 50 Ng, Ur ~~LOC~~: NOT DETECTED
Cocaine Metabolite,Ur ~~LOC~~: NOT DETECTED
MDMA (Ecstasy)Ur Screen: NOT DETECTED
Methadone Scn, Ur: NOT DETECTED
Opiate, Ur Screen: NOT DETECTED
Phencyclidine (PCP) Ur S: NOT DETECTED
Tricyclic, Ur Screen: NOT DETECTED

## 2023-03-21 LAB — COMPREHENSIVE METABOLIC PANEL
ALT: 15 U/L (ref 0–44)
AST: 35 U/L (ref 15–41)
Albumin: 2.7 g/dL — ABNORMAL LOW (ref 3.5–5.0)
Alkaline Phosphatase: 147 U/L — ABNORMAL HIGH (ref 38–126)
Anion gap: 11 (ref 5–15)
BUN: 10 mg/dL (ref 6–20)
CO2: 24 mmol/L (ref 22–32)
Calcium: 9.1 mg/dL (ref 8.9–10.3)
Chloride: 97 mmol/L — ABNORMAL LOW (ref 98–111)
Creatinine, Ser: 0.87 mg/dL (ref 0.44–1.00)
GFR, Estimated: >60 mL/min (ref >60)
Glucose, Bld: 81 mg/dL (ref 70–99)
Potassium: 4.1 mmol/L (ref 3.5–5.1)
Sodium: 132 mmol/L — ABNORMAL LOW (ref 135–145)
Total Bilirubin: 0.4 mg/dL (ref 0.0–1.2)
Total Protein: 7 g/dL (ref 6.5–8.1)

## 2023-03-21 LAB — CBC
HCT: 30.5 % — ABNORMAL LOW (ref 36.0–46.0)
Hemoglobin: 9.1 g/dL — ABNORMAL LOW (ref 12.0–15.0)
MCH: 20.7 pg — ABNORMAL LOW (ref 26.0–34.0)
MCHC: 29.8 g/dL — ABNORMAL LOW (ref 30.0–36.0)
MCV: 69.5 fL — ABNORMAL LOW (ref 80.0–100.0)
Platelets: 203 10*3/uL (ref 150–400)
RBC: 4.39 MIL/uL (ref 3.87–5.11)
RDW: 17.2 % — ABNORMAL HIGH (ref 11.5–15.5)
WBC: 13.3 10*3/uL — ABNORMAL HIGH (ref 4.0–10.5)
nRBC: 0 % (ref 0.0–0.2)

## 2023-03-21 LAB — PROTEIN / CREATININE RATIO, URINE
Creatinine, Urine: 29 mg/dL
Protein Creatinine Ratio: 1.9 mg/mg{creat} — ABNORMAL HIGH (ref 0.00–0.15)
Total Protein, Urine: 55 mg/dL

## 2023-03-21 MED ORDER — BENZOCAINE-MENTHOL 20-0.5 % EX AERO
1.0000 | INHALATION_SPRAY | CUTANEOUS | Status: DC | PRN
  Administered 2023-03-21: 1 via TOPICAL
  Filled 2023-03-21 (×2): qty 56

## 2023-03-21 MED ORDER — ONDANSETRON HCL 4 MG PO TABS: 4.0000 mg | ORAL_TABLET | ORAL | Status: DC | PRN

## 2023-03-21 MED ORDER — LABETALOL HCL 5 MG/ML IV SOLN: 40.0000 mg | INTRAVENOUS | Status: DC | PRN

## 2023-03-21 MED ORDER — DIBUCAINE (PERIANAL) 1 % EX OINT
1.0000 | TOPICAL_OINTMENT | CUTANEOUS | Status: DC | PRN
  Filled 2023-03-21: qty 28

## 2023-03-21 MED ORDER — SIMETHICONE 80 MG PO CHEW: 80.0000 mg | CHEWABLE_TABLET | ORAL | Status: DC | PRN

## 2023-03-21 MED ORDER — PRENATAL MULTIVITAMIN CH
1.0000 | ORAL_TABLET | Freq: Every day | ORAL | Status: DC
  Administered 2023-03-24: 1 via ORAL
  Filled 2023-03-21 (×2): qty 1

## 2023-03-21 MED ORDER — ACETAMINOPHEN 325 MG PO TABS
650.0000 mg | ORAL_TABLET | ORAL | Status: DC | PRN
  Administered 2023-03-22 (×2): 650 mg via ORAL
  Filled 2023-03-21 (×2): qty 2

## 2023-03-21 MED ORDER — LACTATED RINGERS IV SOLN: INTRAVENOUS | Status: DC

## 2023-03-21 MED ORDER — MAGNESIUM SULFATE 40 GM/1000ML IV SOLN
2.0000 g/h | INTRAVENOUS | Status: DC
  Administered 2023-03-21: 2 g/h via INTRAVENOUS
  Filled 2023-03-21: qty 1000

## 2023-03-21 MED ORDER — MAGNESIUM SULFATE BOLUS VIA INFUSION
4.0000 g | Freq: Once | INTRAVENOUS | Status: AC
  Administered 2023-03-21: 4 g via INTRAVENOUS
  Filled 2023-03-21: qty 1000

## 2023-03-21 MED ORDER — FERROUS SULFATE 325 (65 FE) MG PO TABS
325.0000 mg | ORAL_TABLET | Freq: Two times a day (BID) | ORAL | Status: DC
  Administered 2023-03-22 – 2023-03-24 (×5): 325 mg via ORAL
  Filled 2023-03-21 (×6): qty 1

## 2023-03-21 MED ORDER — LABETALOL HCL 5 MG/ML IV SOLN
20.0000 mg | INTRAVENOUS | Status: DC | PRN
Start: 2023-03-21 — End: 2023-03-24
  Administered 2023-03-21: 20 mg via INTRAVENOUS
  Filled 2023-03-21: qty 4

## 2023-03-21 MED ORDER — IBUPROFEN 600 MG PO TABS
600.0000 mg | ORAL_TABLET | Freq: Four times a day (QID) | ORAL | Status: DC
  Administered 2023-03-22 – 2023-03-24 (×7): 600 mg via ORAL
  Filled 2023-03-21 (×9): qty 1

## 2023-03-21 MED ORDER — HYDRALAZINE HCL 20 MG/ML IJ SOLN: 10.0000 mg | INTRAMUSCULAR | Status: DC | PRN

## 2023-03-21 MED ORDER — TETANUS-DIPHTH-ACELL PERTUSSIS 5-2.5-18.5 LF-MCG/0.5 IM SUSY
0.5000 mL | PREFILLED_SYRINGE | Freq: Once | INTRAMUSCULAR | Status: DC
  Filled 2023-03-21: qty 0.5

## 2023-03-21 MED ORDER — COCONUT OIL OIL: 1.0000 | TOPICAL_OIL | Status: DC | PRN

## 2023-03-21 MED ORDER — DIPHENHYDRAMINE HCL 25 MG PO CAPS: 25.0000 mg | ORAL_CAPSULE | Freq: Four times a day (QID) | ORAL | Status: DC | PRN

## 2023-03-21 MED ORDER — ONDANSETRON HCL 4 MG/2ML IJ SOLN: 4.0000 mg | INTRAMUSCULAR | Status: DC | PRN

## 2023-03-21 MED ORDER — LABETALOL HCL 5 MG/ML IV SOLN: 80.0000 mg | INTRAVENOUS | Status: DC | PRN

## 2023-03-21 MED ORDER — SENNOSIDES-DOCUSATE SODIUM 8.6-50 MG PO TABS
2.0000 | ORAL_TABLET | Freq: Every day | ORAL | Status: DC
  Administered 2023-03-22 – 2023-03-24 (×2): 2 via ORAL
  Filled 2023-03-21 (×3): qty 2

## 2023-03-21 MED ORDER — WITCH HAZEL-GLYCERIN EX PADS
1.0000 | MEDICATED_PAD | CUTANEOUS | Status: DC | PRN
  Administered 2023-03-21: 1 via TOPICAL
  Filled 2023-03-21 (×2): qty 100

## 2023-03-21 NOTE — OB Triage Note (Signed)
Patient came in VIA [facility] is G2P1 approx [redacted]w[redacted]d . Pt delivered at home at 18:17 with a 7 minute reported APGAR by ems of 6. Patient called EMS due to placenta not being delivered yet.   Per EMS pt BP 170's/90's. Patient reports p[ain 8/10 right now with placenta still in.  Patient reports no headache, no blurry vison, HAS plus 2 pitting edema bilateral lower legs upon assessment; with 2-3 beats of clonus on each foot.  Patient stated water broke right before delivery of baby. And that labor had started earlier this morning around noon. \   PROVIDER IN ROOM Annamary Rummage CNM

## 2023-03-21 NOTE — Progress Notes (Signed)
S:  Pt denies HA, vision changes, epigastric pain  O:  Results for orders placed or performed during the hospital encounter of 03/21/23 (from the past 24 hours)  Comprehensive metabolic panel     Status: Abnormal   Collection Time: 03/21/23  7:44 PM  Result Value Ref Range   Sodium 132 (L) 135 - 145 mmol/L   Potassium 4.1 3.5 - 5.1 mmol/L   Chloride 97 (L) 98 - 111 mmol/L   CO2 24 22 - 32 mmol/L   Glucose, Bld 81 70 - 99 mg/dL   BUN 10 6 - 20 mg/dL   Creatinine, Ser 1.61 0.44 - 1.00 mg/dL   Calcium 9.1 8.9 - 09.6 mg/dL   Total Protein 7.0 6.5 - 8.1 g/dL   Albumin 2.7 (L) 3.5 - 5.0 g/dL   AST 35 15 - 41 U/L   ALT 15 0 - 44 U/L   Alkaline Phosphatase 147 (H) 38 - 126 U/L   Total Bilirubin 0.4 0.0 - 1.2 mg/dL   GFR, Estimated >04 >54 mL/min   Anion gap 11 5 - 15  CBC     Status: Abnormal   Collection Time: 03/21/23  7:44 PM  Result Value Ref Range   WBC 13.3 (H) 4.0 - 10.5 K/uL   RBC 4.39 3.87 - 5.11 MIL/uL   Hemoglobin 9.1 (L) 12.0 - 15.0 g/dL   HCT 09.8 (L) 11.9 - 14.7 %   MCV 69.5 (L) 80.0 - 100.0 fL   MCH 20.7 (L) 26.0 - 34.0 pg   MCHC 29.8 (L) 30.0 - 36.0 g/dL   RDW 82.9 (H) 56.2 - 13.0 %   Platelets 203 150 - 400 K/uL   nRBC 0.0 0.0 - 0.2 %    Vitals:   03/21/23 1915 03/21/23 1950 03/21/23 2000 03/21/23 2002  BP: (!) 152/110 (!) 143/93 (!) 153/101 (!) 159/98   03/21/23 2025 03/21/23 2030 03/21/23 2046  BP: (!) 153/100 (!) 171/101 (!) 165/88    A: 22yo G2P1 at [redacted]w[redacted]d delivered at home, diagnosis with Pre-E  P: - Dr. Jean Rosenthal updated on patient status - Start Mag Sulfate - Treat elevated BPs with Labetalol

## 2023-03-21 NOTE — H&P (Signed)
OB History & Physical   History of Present Illness:  Chief Complaint:   HPI:  Tracey Chavez is a 23 y.o. G83P0010 female at [redacted]w[redacted]d dated by 33 week u/s.  She presents to L&D after delivering at home.  Placenta needs to be delivered.  No prenatal care, seen once in triage at 33 weeks.  Pregnancy Issues: Patient Active Problem List   Diagnosis Date Noted   Edema during pregnancy 01/05/2023   No prenatal care in current pregnancy in third trimester 01/05/2023   MDD (major depressive disorder), severe (HCC) 07/21/2017   Overdose of nonsteroidal anti-inflammatory drug (NSAID) 07/20/2017   Severe major depression, single episode, without psychotic features (HCC) 07/20/2017   Cannabis abuse 07/20/2017      Maternal Medical History:   Past Medical History:  Diagnosis Date   Asthma    Mental disorder 2019   hospitalized 2019   Sickle cell anemia (HCC)    sickle cell trait only/ no disease    Past Surgical History:  Procedure Laterality Date   NO PAST SURGERIES      No Known Allergies  Prior to Admission medications   Medication Sig Start Date End Date Taking? Authorizing Provider  Prenatal Vit-Fe Fumarate-FA (PRENATAL MULTIVITAMIN) TABS tablet Take 1 tablet by mouth daily at 12 noon.    [provider]     Prenatal care site: [facility]   Social History: She  reports that she has never smoked. She has never used smokeless tobacco. She reports that she does not currently use alcohol. She reports that she does not currently use drugs after having used the following drugs: Marijuana.  Family History: family history is not on file.   Review of Systems: A full review of systems was performed and negative except as noted in the HPI.    Physical Exam:  Vital Signs: LMP 05/17/2022 (Exact Date)   General:   alert and cooperative  Skin:  normal  Neurologic:    Alert & oriented x 3  Lungs:    Nl effort  Heart:   regular rate and rhythm  Abdomen:  soft,  non-tender; bowel sounds normal; no masses,  no organomegaly  Extremities: : non-tender, symmetric, +1 edema bilaterally.       Pertinent Results:  Prenatal Labs: Blood type/Rh A pos  Antibody screen neg  Rubella Immune  Varicella Immune  RPR NR  HBsAg Neg  HIV NR  GC neg  Chlamydia neg  Genetic screening   1 hour GTT Not done  3 hour GTT   GBS Not done    Assessment:  Tracey Chavez is a 23 y.o. G2P0010 female at [redacted]w[redacted]d with delivery of baby at home.   Plan:  1. Admit to Labor & Delivery; consents reviewed and obtained  2. Fetal Well being  - GBS unknown - No prenatal care, UDS ordered  3. Routine OB: - Prenatal labs reviewed, as above - Rh pos - CBC & T&S on admit  4. Elevated BPs on arrival - Denies HA, vision changes and epigastric pain - PIH labs ordered  4. Post Partum Planning: - Infant feeding: Breastfeeding - Contraception: TBD   Haroldine Laws, CNM 03/21/2023 7:11 PM

## 2023-03-22 LAB — CBC
HCT: 25.9 % — ABNORMAL LOW (ref 36.0–46.0)
Hemoglobin: 7.9 g/dL — ABNORMAL LOW (ref 12.0–15.0)
MCH: 21.3 pg — ABNORMAL LOW (ref 26.0–34.0)
MCHC: 30.5 g/dL (ref 30.0–36.0)
MCV: 69.8 fL — ABNORMAL LOW (ref 80.0–100.0)
Platelets: 197 10*3/uL (ref 150–400)
RBC: 3.71 MIL/uL — ABNORMAL LOW (ref 3.87–5.11)
RDW: 17 % — ABNORMAL HIGH (ref 11.5–15.5)
WBC: 17.7 10*3/uL — ABNORMAL HIGH (ref 4.0–10.5)
nRBC: 0 % (ref 0.0–0.2)

## 2023-03-22 MED ORDER — IRON SUCROSE 300 MG IVPB - SIMPLE MED
300.0000 mg | Freq: Once | Status: AC
  Administered 2023-03-22: 300 mg via INTRAVENOUS
  Filled 2023-03-22: qty 300

## 2023-03-22 MED ORDER — CALCIUM GLUCONATE 10 % IV SOLN
INTRAVENOUS | Status: AC
  Filled 2023-03-22: qty 10

## 2023-03-22 MED ORDER — NIFEDIPINE ER OSMOTIC RELEASE 30 MG PO TB24
30.0000 mg | ORAL_TABLET | Freq: Every day | ORAL | Status: DC
  Administered 2023-03-22 – 2023-03-24 (×3): 30 mg via ORAL
  Filled 2023-03-22 (×3): qty 1

## 2023-03-22 NOTE — Lactation Note (Signed)
This note was copied from a baby's chart. Lactation Consultation Note  Patient Name: Tracey Chavez [id number] Date: 03/22/2023 Age:25 hours Reason for consult: Follow-up assessment;Primapara;Term;Breastfeeding assistance   Maternal Data Follow up assessment to assist patient w/ a feeding at the breast.  Feeding Mother's Current Feeding Choice: Breast Milk and Formula Nipple Type: Slow - flow  LC returned to assist patient w/ a feeding at the breast.  Infant was placed in football position on the left breast.  Several attempts were made to get infant to latch.  Infant opens mouth, not wide enough, but gets on the breast and doesn't suckle.  After several attempts, LC had patient put infant STS and then reattempt in 15 minutes.    During the suck test on LCs finger, infant suck is very chompy and uncoordinated.    LATCH Score Latch: Too sleepy or reluctant, no latch achieved, no sucking elicited.  Audible Swallowing: None  Type of Nipple: Everted at rest and after stimulation (short)  Comfort (Breast/Nipple): Soft / non-tender  Hold (Positioning): Full assist, staff holds infant at breast  LATCH Score: 4  Interventions Interventions: Breast feeding basics reviewed;Assisted with latch;Skin to skin;Breast massage;Hand express;Adjust position;Support pillows;Position options;Education  LC provided education on the following;  milk production expectations, hunger cues, day 1/2 wet/dirty diapers, hand expression,  benefits of STS and arousing infant for a feeding.  Lactation informed patient of feeding infant at least 8 or more times w/in a 24hr period but not exceeding 3hrs. Patient verbalized understanding.   Patient inquired to [facility] about pumps because she does not have one.  LC encouraged a DEBP.    Discharge WIC Program: No (Patient is interested in participating w/ [facility].)  Patient does not have WIC as of right now but stated that she is interested in getting WIC.   Consult  Status Consult Status: Follow-up from L&D Follow-up type: In-patient    Yvette Rack Brighten Orndoff 03/22/2023, 3:30 PM

## 2023-03-22 NOTE — Lactation Note (Signed)
This note was copied from a baby's chart. Lactation Consultation Note  Patient Name: Tracey Chavez [id number] Date: 03/22/2023 Age:25 hours Reason for consult: L&D Initial assessment;Primapara;Term   Maternal Data Initial assessment w/ a P1 patient and a 17hr old baby Tracey.  This was a vaginal delivery at home. Patient w/ hx of pre-E w/ severe features, severe major depressive disorder, and mental disorder.  Patient stated she could do either breastfeeding or formula.  Patient doesn't seem to sure of what she exactly wants to do.   Feeding Mother's Current Feeding Choice: Breast Milk and Formula  No feeding observed, lactation will return for next feeding.  Interventions Interventions: Breast feeding basics reviewed;Education  LC briefly touched on feeding infant 8-12x w/in 24hr period.  Lactation will return for the next feeding.  Consult Status Consult Status: Follow-up from L&D Follow-up type: In-patient    Yvette Rack Atticus Lemberger 03/22/2023, 12:17 PM

## 2023-03-22 NOTE — Progress Notes (Signed)
  Subjective: 23 y.o. G2P1011 ambulating in room and up to bedside commode. Tolerating PO meds and diet. Reports occasional blurred vision, denies HA, and RUQ pain.  Mostly wanting to sleep.    Objective: BP 122/80   Pulse 96   Temp 97.8 F (36.6 C) (Oral)   Resp 16   LMP 05/17/2022 (Exact Date)   SpO2 100%   Breastfeeding Unknown    Vitals:   03/22/23 1001 03/22/23 1121 03/22/23 1231 03/22/23 1333  BP: (!) 139/95 (!) 158/108 (!) 147/106 (!) 164/102   03/22/23 1346 03/22/23 1401 03/22/23 1416 03/22/23 1430  BP: (!) 141/89 (!) 143/90 (!) 143/94 (!) 145/93   03/22/23 1446 03/22/23 1501 03/22/23 1515 03/22/23 1530  BP: 136/82 (!) 135/92 128/84 122/80    Physical Exam:  General: fatigued and no distress Pulm: nl effort Extremities: No edema, DTR 2+/2+  Recent Labs    03/21/23 1944 03/22/23 0651  HGB 9.1* 7.9*  HCT 30.5* 25.9*  WBC 13.3* 17.7*  PLT 203 197   Total I/O In: 1232.2 [P.O.:600; I.V.:632.2] Out: 2200 [Urine:2200]   Assessment/Plan: 23 y.o. G2P1011 postpartum day # 1  Preeclampsia with severe features -Mag sulfate infusion stopped at 1240 -BP's mild range with occasional normal BP -Last severe range BP at 1333 - 164/102->141/89 -Last dose of IV labetalol at 2130 -Repeat labs scheduled for 1600 -diuresing well - urine output 2200 since 0700 -Started on PO procardia  -Will transfer to M/B with routine PP orders  -Continue vital signs q 4 hours     LOS: 1 day   Gustavo Lah, CNM 03/22/2023, 3:37 PM   ----- Margaretmary Eddy  Certified Nurse Midwife [facility]

## 2023-03-22 NOTE — Progress Notes (Signed)
Postpartum Day  1  Subjective: 23 y.o. G2P1011 postpartum day #1 status post unattended normal spontaneous vaginal delivery at home. She is ambulating to bedside commode, is tolerating po, is voiding spontaneously.  Her pain is well controlled on PO pain medications. Her lochia is less than menses. Feeling tired.  Objective: BP 131/84   Pulse 81   Temp 98.4 F (36.9 C) (Oral)   Resp 15   LMP 05/17/2022 (Exact Date)   SpO2 99%   Breastfeeding Unknown   Vitals:   03/21/23 2140 03/21/23 2149 03/21/23 2201 03/21/23 2301  BP: (!) 147/97 (!) 165/133 (!) 166/107 (!) 143/98   03/22/23 0009 03/22/23 0101 03/22/23 0200 03/22/23 0301  BP: (!) 140/90 (!) 142/87 (!) 117/99 (!) 144/86   03/22/23 0400 03/22/23 0556 03/22/23 0735 03/22/23 0810  BP: 124/82 131/87 (!) 155/100 131/84     Physical Exam:  General: fatigued, no distress, and pale Breasts: soft/nontender Pulm: nl effort Abdomen: soft, non-tender, active bowel sounds Uterine Fundus: firm Perineum: minimal edema Lochia: appropriate DVT Evaluation: No evidence of DVT seen on physical exam.  Recent Labs    03/21/23 1944 03/22/23 0651  HGB 9.1* 7.9*  HCT 30.5* 25.9*  WBC 13.3* 17.7*  PLT 203 197   CMP     Component Value Date/Time   NA 132 (L) 03/21/2023 1944   K 4.1 03/21/2023 1944   CL 97 (L) 03/21/2023 1944   CO2 24 03/21/2023 1944   GLUCOSE 81 03/21/2023 1944   BUN 10 03/21/2023 1944   CREATININE 0.87 03/21/2023 1944   CALCIUM 9.1 03/21/2023 1944   PROT 7.0 03/21/2023 1944   ALBUMIN 2.7 (L) 03/21/2023 1944   AST 35 03/21/2023 1944   ALT 15 03/21/2023 1944   ALKPHOS 147 (H) 03/21/2023 1944   BILITOT 0.4 03/21/2023 1944   GFRNONAA >60 03/21/2023 1944    Intake/Output Summary (Last 24 hours) at 03/22/2023 0901 Last data filed at 03/22/2023 0830 Gross per 24 hour  Intake 1652.4 ml  Output 2995 ml  Net -1342.6 ml      Assessment/Plan: 22 y.o. G2P1011 postpartum day # 1  1. Continue routine postpartum  care -TOC consult placed for no prenatal care and unintended home deliery   2. Infant feeding status: breast feeding -Lactation consult PRN for breastfeeding   3. Contraception plan:  TBD  4. Acute blood loss anemia - clinically significant.  -Hgb 9.1->7.9 -Hemodynamically stable and asymptomatic -Intervention: IV iron transfusion with venofer ordered   5. Immunization status:   all immunizations up to date  6. Preeclampsia with severe features  -Mag sulfate infusing at 2grams/hour  -Urine output on last night 1670 ml, this morning (0700 to 0900) 1200, diuresing well. -BP's norma to mild range -Last severe range BP at 2201, last IV labetalol at 2130 -Procardia 30 mg XL ordered to start this morning  -Repeat labs ordered for 1600 -Consider d/c mag after 12 hours if continues to diurese well and BP's stable   Disposition: continue inpatient postpartum care    LOS: 1 day   Gustavo Lah, CNM 03/22/2023, 8:53 AM   ----- Margaretmary Eddy  Certified Nurse Midwife [facility]

## 2023-03-23 LAB — COMPREHENSIVE METABOLIC PANEL
ALT: 17 U/L (ref 0–44)
AST: 41 U/L (ref 15–41)
Albumin: 2.3 g/dL — ABNORMAL LOW (ref 3.5–5.0)
Alkaline Phosphatase: 114 U/L (ref 38–126)
Anion gap: 11 (ref 5–15)
BUN: 11 mg/dL (ref 6–20)
CO2: 26 mmol/L (ref 22–32)
Calcium: 7 mg/dL — ABNORMAL LOW (ref 8.9–10.3)
Chloride: 96 mmol/L — ABNORMAL LOW (ref 98–111)
Creatinine, Ser: 1.06 mg/dL — ABNORMAL HIGH (ref 0.44–1.00)
GFR, Estimated: >60 mL/min (ref >60)
Glucose, Bld: 123 mg/dL — ABNORMAL HIGH (ref 70–99)
Potassium: 3.8 mmol/L (ref 3.5–5.1)
Sodium: 133 mmol/L — ABNORMAL LOW (ref 135–145)
Total Bilirubin: 0.5 mg/dL (ref 0.0–1.2)
Total Protein: 5.6 g/dL — ABNORMAL LOW (ref 6.5–8.1)

## 2023-03-23 LAB — CBC
HCT: 23.7 % — ABNORMAL LOW (ref 36.0–46.0)
HCT: 21.9 % — ABNORMAL LOW (ref 36.0–46.0)
Hemoglobin: 7 g/dL — ABNORMAL LOW (ref 12.0–15.0)
Hemoglobin: 6.6 g/dL — ABNORMAL LOW (ref 12.0–15.0)
MCH: 21 pg — ABNORMAL LOW (ref 26.0–34.0)
MCH: 21 pg — ABNORMAL LOW (ref 26.0–34.0)
MCHC: 29.5 g/dL — ABNORMAL LOW (ref 30.0–36.0)
MCHC: 30.1 g/dL (ref 30.0–36.0)
MCV: 71 fL — ABNORMAL LOW (ref 80.0–100.0)
MCV: 69.5 fL — ABNORMAL LOW (ref 80.0–100.0)
Platelets: 244 10*3/uL (ref 150–400)
Platelets: 219 10*3/uL (ref 150–400)
RBC: 3.34 MIL/uL — ABNORMAL LOW (ref 3.87–5.11)
RBC: 3.15 MIL/uL — ABNORMAL LOW (ref 3.87–5.11)
RDW: 17.2 % — ABNORMAL HIGH (ref 11.5–15.5)
RDW: 17.5 % — ABNORMAL HIGH (ref 11.5–15.5)
WBC: 18.4 10*3/uL — ABNORMAL HIGH (ref 4.0–10.5)
WBC: 19.9 10*3/uL — ABNORMAL HIGH (ref 4.0–10.5)
nRBC: 0.5 % — ABNORMAL HIGH (ref 0.0–0.2)
nRBC: 0.8 % — ABNORMAL HIGH (ref 0.0–0.2)

## 2023-03-23 LAB — WET PREP, GENITAL
Clue Cells Wet Prep HPF POC: NONE SEEN
Sperm: NONE SEEN
Trich, Wet Prep: NONE SEEN
WBC, Wet Prep HPF POC: >=10 — AB (ref <10)
Yeast Wet Prep HPF POC: NONE SEEN

## 2023-03-23 LAB — PREPARE RBC (CROSSMATCH)

## 2023-03-23 LAB — RPR: RPR Ser Ql: NONREACTIVE

## 2023-03-23 MED ORDER — COCONUT OIL OIL: 1.0000 | TOPICAL_OIL | Status: AC | PRN

## 2023-03-23 MED ORDER — SODIUM CHLORIDE 0.9 % IV SOLN
INTRAVENOUS | Status: DC | PRN
Start: 2023-03-23 — End: 2023-03-24

## 2023-03-23 MED ORDER — WITCH HAZEL-GLYCERIN EX PADS: 1.0000 | MEDICATED_PAD | CUTANEOUS | Status: AC | PRN

## 2023-03-23 MED ORDER — BENZOCAINE-MENTHOL 20-0.5 % EX AERO: 1.0000 | INHALATION_SPRAY | CUTANEOUS | Status: AC | PRN

## 2023-03-23 MED ORDER — FERROUS SULFATE 325 (65 FE) MG PO TABS: 325.0000 mg | ORAL_TABLET | Freq: Two times a day (BID) | ORAL | Status: AC

## 2023-03-23 MED ORDER — IRON SUCROSE 300 MG IVPB - SIMPLE MED
300.0000 mg | Freq: Once | Status: AC
  Administered 2023-03-23: 300 mg via INTRAVENOUS
  Filled 2023-03-23: qty 265

## 2023-03-23 MED ORDER — NIFEDIPINE ER 30 MG PO TB24: 30.0000 mg | ORAL_TABLET | Freq: Every day | ORAL | 0 refills | Status: DC

## 2023-03-23 MED ORDER — DIBUCAINE (PERIANAL) 1 % EX OINT: 1.0000 | TOPICAL_OINTMENT | CUTANEOUS | Status: AC | PRN

## 2023-03-23 MED ORDER — SIMETHICONE 80 MG PO CHEW: 80.0000 mg | CHEWABLE_TABLET | ORAL | Status: AC | PRN

## 2023-03-23 MED ORDER — ACETAMINOPHEN 325 MG PO TABS: 650.0000 mg | ORAL_TABLET | ORAL | Status: AC | PRN

## 2023-03-23 MED ORDER — SENNOSIDES-DOCUSATE SODIUM 8.6-50 MG PO TABS: 2.0000 | ORAL_TABLET | Freq: Every day | ORAL | Status: AC

## 2023-03-23 MED ORDER — FENTANYL CITRATE PF 50 MCG/ML IJ SOSY
50.0000 ug | PREFILLED_SYRINGE | Freq: Once | INTRAMUSCULAR | Status: AC
  Administered 2023-03-23: 50 ug via INTRAVENOUS
  Filled 2023-03-23 (×2): qty 1

## 2023-03-23 MED ORDER — IBUPROFEN 600 MG PO TABS: 600.0000 mg | ORAL_TABLET | Freq: Four times a day (QID) | ORAL | 0 refills | Status: AC

## 2023-03-23 NOTE — Progress Notes (Signed)
TOC aware that this patient needs to be seen ASAP before discharge tomorrow. Patient to receive blood transfusion overnight.

## 2023-03-23 NOTE — Progress Notes (Signed)
Postpartum Day  2  Subjective: 23 y.o. G2P1011 postpartum day #2 status post normal spontaneous vaginal delivery. She is ambulating, is tolerating po, is voiding spontaneously.  Her pain is well controlled on PO pain medications. Her lochia is less than menses.  Objective: BP (!) 127/92   Pulse 88   Temp 98.8 F (37.1 C) (Oral)   Resp 19   LMP 05/17/2022 (Exact Date)   SpO2 100%   Breastfeeding Unknown    Physical Exam:  General: alert, cooperative, and appears stated age Breasts: soft/nontender Pulm: nl effort Abdomen: soft, non-tender, active bowel sounds Uterine Fundus: firm Perineum: minimal edema, laceration hemostatic Lochia: appropriate DVT Evaluation: No evidence of DVT seen on physical exam. Negative Homan's sign. No cords or calf tenderness. No significant calf/ankle edema.  Recent Labs    03/23/23 0455 03/23/23 1548  HGB 7.0* 6.6*  HCT 23.7* 21.9*  WBC 18.4* 19.9*  PLT 244 219    Assessment/Plan: 22 y.o. G2P1011 Postpartum Day  2  1. Continue routine postpartum care  2. Infant feeding status: formula feeding --Encouraged snug fitting bra, cold application, Tylenol PRN, and cabbage leaves for engorgement for formula feeding   3. Contraception plan: oral contraceptives (estrogen/progesterone)  4. Acute blood loss anemia - clinically significant.  --Hemodynamically stable and symptomatic --Intervention: continue on oral supplementation with ferrous sulfate 325, IV iron transfusion with venofer ordered , recommend blood transfusion 1 unit pRBC, and continue to monitor H&H  5. Immunization status:   all immunizations up to date  6. TOC consult placed, not completed  Disposition: continue inpatient postpartum care , plan for discharge home tomorrow    LOS: 2 days   Chari Manning, CNM 03/23/2023, 9:28 PM   ----- Chari Manning Certified Nurse Midwife [facility]

## 2023-03-23 NOTE — Progress Notes (Signed)
Blood transfusion started. Verified with second RN Modena Morrow. Will continue to monitor.

## 2023-03-23 NOTE — Lactation Note (Addendum)
This note was copied from a baby's chart. Lactation Consultation Note  Patient Name: Tracey Chavez [id number] Date: 03/23/2023 Age:25 hours Reason for consult: Follow-up assessment;Primapara;Term;Other (Comment) (Baby in SCN)   Maternal Data This is mom's first baby,SVD delivered at home. Mom with history of no prenatal care and non-prescription drug use.Mom's UDS was negative on admission but baby was positive for opiates. Baby in SCN related to  NAS and slow feeding.   On follow-up today breastpump is set-up in room. Mom has not pumped today. Per mom she does want to pump and plans on doing so later today. Has patient been taught Hand Expression?: Yes Does the patient have breastfeeding experience prior to this delivery?: No  Feeding Mother's Current Feeding Choice: Breast Milk and Formula   Lactation Tools Discussed/Used Tools: Pump Breast pump type: Double-Electric Breast Pump Pump Education: Setup, frequency, and cleaning;Milk Storage (Put was set-up at bedside. Mom has not been consistently pumping. Reviewed pump education. Mom verbalized understanding.) Reason for Pumping: Baby IN SCN Pumping frequency: goal is 8 times/24 hours to maximize milk production Pumped volume:  (Mom has not expressed any colostrum today)  Interventions Interventions: DEBP;Education Discussed with mom if her plan is to provide breastmilk pumping consistently will maximize milk production. Also, discussed with mom the potential for drug transfer into breastmilk if she takes any medications. Prescribed medications can easily be referenced for safety with breastfeeding. Any other drugs taken on her own whether over the counter and/or not prescribed pose a risk to baby if not checked for safety if she is providing breastmilk to baby. Mom verbalized understanding provided.  Discharge Pump: [facility] Loaner WIC Program: Yes Referral faxed for use of DEBP  Consult Status Consult Status: Follow-up Date:  03/24/23 Follow-up type: In-patient  Update provided to [facility] care nurse.  Tracey Chavez 03/23/2023, 2:37 PM

## 2023-03-23 NOTE — TOC Initial Note (Signed)
Transition of Care [facility]) - Initial/Assessment Note    Patient Details  Name: Tracey Chavez MRN: [id number] Date of Birth: 01/30/2001  Transition of Care [facility]) CM/SW Contact:    Carmina Miller, LCSWA Phone Number: 03/23/2023, 6:47 PM  Clinical Narrative:                  CSW received call from [facility] supervisor that pt needed assessment due to no late/prenatal care, baby born at home, baby showing possible signs of withdrawal, CSW attempted to speak with pt however she stated she was preparing to receive a blood transfusion due to low hemoglobin and requested CSW call back later. TOC will follow up with pt tomorrow for full assessment.         Patient Goals and CMS Choice            Expected Discharge Plan and Services         Expected Discharge Date: 03/23/23                                    Prior Living Arrangements/Services                       Activities of Daily Living   ADL Screening (condition at time of admission) Independently performs ADLs?: Yes (appropriate for developmental age) Is the patient deaf or have difficulty hearing?: No Does the patient have difficulty seeing, even when wearing glasses/contacts?: No Does the patient have difficulty concentrating, remembering, or making decisions?: No  Permission Sought/Granted                  Emotional Assessment              Admission diagnosis:  Precipitate labor, with delivery [O62.3] Patient Active Problem List   Diagnosis Date Noted   Precipitate labor, with delivery 03/21/2023   Edema during pregnancy 01/05/2023   No prenatal care in current pregnancy in third trimester 01/05/2023   MDD (major depressive disorder), severe (HCC) 07/21/2017   Overdose of nonsteroidal anti-inflammatory drug (NSAID) 07/20/2017   Severe major depression, single episode, without psychotic features (HCC) 07/20/2017   Cannabis abuse 07/20/2017   PCP:  Oneita Hurt, No Pharmacy:   [facility]  [address], [place] - [address] [place] [address] Phone: [phone] Fax: [phone]     Social Drivers of Health (SDOH) Social History: SDOH Screenings   Food Insecurity: No Food Insecurity (03/21/2023)  Housing: Low Risk  (03/21/2023)  Transportation Needs: No Transportation Needs (03/21/2023)  Utilities: Not At Risk (03/21/2023)  Alcohol Screen: Low Risk  (07/21/2017)  Depression (PHQ2-9): Low Risk  (08/25/2022)  Social Connections: Unknown (03/21/2023)  Tobacco Use: Low Risk  (03/21/2023)   SDOH Interventions:     Readmission Risk Interventions     No data to display

## 2023-03-23 NOTE — Progress Notes (Addendum)
CNM at bedside to do membrane sweep. Pain medication given prior per order. Blood transfusion to be started after procedure. Then iron infusion to follow. CBC to be placed for four hours after blood transfusion is done. CBC ordered for 0400 03/24/2023. Interventions explained to patient. Patient stated understanding. No questions or concerns at this time.

## 2023-03-23 NOTE — Discharge Summary (Incomplete)
Postpartum Discharge Summary  Patient Name: Tracey Chavez DOB: 10-01-2000 MRN: [id number]  Date of admission: 03/21/2023 Delivery date:03/21/2023 Delivering provider: Haroldine Laws Date of discharge: 03/23/2023  Primary OB:  none GEX:[id number] last menstrual period was 05/17/2022 (exact date). EDC Estimated Date of Delivery: 02/21/23 Gestational Age at Delivery: [redacted]w[redacted]d   Admitting diagnosis: Precipitate labor, with delivery [O62.3] Intrauterine pregnancy: [redacted]w[redacted]d     Secondary diagnosis:   Principal Problem:   Precipitate labor, with delivery   Discharge Diagnosis: postterm pregnancy delivered      Hospital course: Tracey Chavez is a 23 y.o. G45P0010 female at [redacted]w[redacted]d dated by 33 week u/s.  She presents to L&D after delivering at home.  Placenta needs to be delivered.  No prenatal care, seen once in triage at 33 weeks.                                             Post partum procedures: IV Venofer Complications: None Delivery Type: spontaneous vaginal delivery Anesthesia: none Placenta: spontaneous To Pathology: No   Prenatal Labs:  *** (copy from H&P)  Magnesium Sulfate received: No BMZ received: No Rhophylac:was not indicated MMR: was not indicated Varivax vaccine given: was not indicated - Tdap vaccine:  not given - Flu vaccine:  not given -RSV vaccine:not given  Transfusion:Yes  Physical exam  Vitals:   03/22/23 2345 03/23/23 0300 03/23/23 0859 03/23/23 1204  BP: (!) 124/90 134/84 (!) 120/93 123/79  Pulse: 90 80 91 95  Resp: 17 16 18 20   Temp: 98.3 F (36.8 C) 98.4 F (36.9 C)  98.8 F (37.1 C)  TempSrc: Oral Oral    SpO2: 100% 98% 97% 97%   General: alert, cooperative, and no distress Lochia: appropriate Uterine Fundus: firm Perineum:minimal edema/intact DVT Evaluation: No evidence of DVT seen on physical exam. Negative Homan's sign. No cords or calf tenderness. No significant calf/ankle edema.  Labs: Lab Results  Component Value Date   WBC  19.9 (H) 03/23/2023   HGB 6.6 (L) 03/23/2023   HCT 21.9 (L) 03/23/2023   MCV 69.5 (L) 03/23/2023   PLT 219 03/23/2023      Latest Ref Rng & Units 03/23/2023    4:55 AM  CMP  Glucose 70 - 99 mg/dL 401   BUN 6 - 20 mg/dL 11   Creatinine 0.27 - 1.00 mg/dL 2.53   Sodium 664 - 403 mmol/L 133   Potassium 3.5 - 5.1 mmol/L 3.8   Chloride 98 - 111 mmol/L 96   CO2 22 - 32 mmol/L 26   Calcium 8.9 - 10.3 mg/dL 7.0   Total Protein 6.5 - 8.1 g/dL 5.6   Total Bilirubin 0.0 - 1.2 mg/dL 0.5   Alkaline Phos 38 - 126 U/L 114   AST 15 - 41 U/L 41   ALT 0 - 44 U/L 17    Edinburgh Score:    03/23/2023    9:00 AM  Edinburgh Postnatal Depression Scale Screening Tool  I have been able to laugh and see the funny side of things. 0  I have looked forward with enjoyment to things. 1  I have blamed myself unnecessarily when things went wrong. 2  I have been anxious or worried for no good reason. 0  I have felt scared or panicky for no good reason. 2  Things have been getting on top of me. 0  I  have been so unhappy that I have had difficulty sleeping. 0  I have felt sad or miserable. 1  I have been so unhappy that I have been crying. 1  The thought of harming myself has occurred to me. 0  Edinburgh Postnatal Depression Scale Total 7    Risk assessment for postpartum VTE and prophylactic treatment: Very high risk factors: None High risk factors: None Moderate risk factors: None  Postpartum VTE prophylaxis with LMWH not indicated  After visit meds:  Allergies as of 03/23/2023   No Known Allergies      Medication List     TAKE these medications    acetaminophen 325 MG tablet Commonly known as: Tylenol Take 2 tablets (650 mg total) by mouth every 4 (four) hours as needed (for pain scale < 4).   benzocaine-Menthol 20-0.5 % Aero Commonly known as: DERMOPLAST Apply 1 Application topically as needed for irritation (perineal discomfort).   coconut oil Oil Apply 1 Application topically as  needed.   dibucaine 1 % Oint Commonly known as: NUPERCAINAL Place 1 Application rectally as needed for hemorrhoids.   ferrous sulfate 325 (65 FE) MG tablet Take 1 tablet (325 mg total) by mouth 2 (two) times daily with a meal. Start taking on: March 24, 2023   ibuprofen 600 MG tablet Commonly known as: ADVIL Take 1 tablet (600 mg total) by mouth every 6 (six) hours.   NIFEdipine 30 MG 24 hr tablet Commonly known as: ADALAT CC Take 1 tablet (30 mg total) by mouth daily. Start taking on: March 24, 2023   prenatal multivitamin Tabs tablet Take 1 tablet by mouth daily at 12 noon.   senna-docusate 8.6-50 MG tablet Commonly known as: Senokot-S Take 2 tablets by mouth daily. Start taking on: March 24, 2023   simethicone 80 MG chewable tablet Commonly known as: MYLICON Chew 1 tablet (80 mg total) by mouth as needed for flatulence.   witch hazel-glycerin pad Commonly known as: TUCKS Apply 1 Application topically as needed for hemorrhoids.       Discharge home in stable condition Infant Feeding: Bottle Infant Disposition:NICU Discharge instruction: per After Visit Summary and Postpartum booklet. Activity: Advance as tolerated. Pelvic rest for 6 weeks.  Diet: routine diet Anticipated Birth Control:  {Contraceptives:21111124:::1} Postpartum Appointment:6 weeks Additional Postpartum F/U: Postpartum Depression checkup Future Appointments:No future appointments. Follow up Visit:  Follow-up Information     Haroldine Laws, CNM. Schedule an appointment as soon as possible for a visit on 03/27/2023.   Specialty: Certified Nurse Midwife Why: blood pressure check Contact information: [address] [place] [address] [phone]         Haroldine Laws, CNM. Schedule an appointment as soon as possible for a visit in 2 week(s).   Specialty: Certified Nurse Midwife Why: postpartum mood check Contact information: [address] [place]  [address] [phone]                 Plan:  [name] was discharged to home in good condition. Follow-up appointment as directed.    Signed: Chari Manning CNM

## 2023-03-24 LAB — CBC
HCT: 29.5 % — ABNORMAL LOW (ref 36.0–46.0)
Hemoglobin: 9 g/dL — ABNORMAL LOW (ref 12.0–15.0)
MCH: 22 pg — ABNORMAL LOW (ref 26.0–34.0)
MCHC: 30.5 g/dL (ref 30.0–36.0)
MCV: 72.1 fL — ABNORMAL LOW (ref 80.0–100.0)
Platelets: 258 10*3/uL (ref 150–400)
RBC: 4.09 MIL/uL (ref 3.87–5.11)
RDW: 17.5 % — ABNORMAL HIGH (ref 11.5–15.5)
WBC: 20.2 10*3/uL — ABNORMAL HIGH (ref 4.0–10.5)
nRBC: 1.3 % — ABNORMAL HIGH (ref 0.0–0.2)

## 2023-03-24 LAB — BPAM RBC
Blood Product Expiration Date: 202502282359
ISSUE DATE / TIME: 202502201950
Unit Type and Rh: 600

## 2023-03-24 LAB — TYPE AND SCREEN
ABO/RH(D): A POS
Antibody Screen: NEGATIVE
Unit division: 0

## 2023-03-24 MED ORDER — NIFEDIPINE ER 30 MG PO TB24: 60.0000 mg | ORAL_TABLET | Freq: Every day | ORAL | 0 refills | Status: AC

## 2023-03-24 NOTE — Progress Notes (Signed)
Lab tech informed this RN that patient did not want labs drawn. This RN asked patient if she was okay and why she did not want labs this am patient stated "she just doesn't and she is tired". CNM notified.

## 2023-03-24 NOTE — Discharge Summary (Signed)
Postpartum Discharge Summary    Patient Name: Tracey Chavez DOB: 09/08/2000 MRN: [id number]  Date of admission: 03/21/2023 Delivery date:03/21/2023 Delivering provider: Haroldine Laws Date of discharge: 03/24/2023  Admitting diagnosis: Precipitate labor, with delivery [O62.3] Intrauterine pregnancy: [redacted]w[redacted]d     Secondary diagnosis:  Principal Problem:   Precipitate labor, with delivery  Additional problems: Home delivery     Discharge diagnosis:  Postterm pregnancy delivered                                                Post partum procedures: IV Venofer  Complications: None  Hospital course: Onset of Labor With Vaginal Delivery      Tracey Chavez is a 23 y.o. G2P0010 female at [redacted]w[redacted]d dated by 33 week u/s. She presents to L&D after delivering at home. Placenta needs to be delivered. No prenatal care, seen once in triage at 33 weeks   Delivery Method:Vaginal, Spontaneous Operative Delivery:N/A Episiotomy: None Lacerations:  None Patient had an uncomplicated postpartum course. She is ambulating, tolerating a regular diet, passing flatus, and urinating well. Patient is discharged home in stable condition on 03/24/23.  Newborn Data: Birth date:03/21/2023 Birth time:6:17 PM Gender:Female Living status:Living Apgars: ,  Weight:3270 g  Magnesium Sulfate received: No BMZ received: No Rhophylac:No MMR:No T-DaP: Not received  Flu: Not received  RSV Vaccine received: Not received  Transfusion:Yes Immunizations administered: There is no immunization history for the selected administration types on file for this patient.  Physical exam  Vitals:   03/23/23 2233 03/24/23 0411 03/24/23 0801 03/24/23 1625  BP: 115/75 115/75 (!) 118/91 130/81  Pulse: (!) 104 93 79 (!) 106  Resp: 18 18 17 18   Temp: 98.6 F (37 C) 98.3 F (36.8 C) 98.4 F (36.9 C) 98.1 F (36.7 C)  TempSrc: Oral Oral Oral Oral  SpO2: 100% 97% 97% 96%   Vitals:   03/22/23 2050  03/22/23 2345 03/23/23 0300 03/23/23 0859  BP: (!) 134/90 (!) 124/90 134/84 (!) 120/93   03/23/23 1204 03/23/23 1945 03/23/23 2005 03/23/23 2028  BP: 123/79 (!) 128/100 129/82 (!) 127/92   03/23/23 2233 03/24/23 0411 03/24/23 0801 03/24/23 1625  BP: 115/75 115/75 (!) 118/91 130/81     General: alert, cooperative, and no distress Lochia: appropriate Uterine Fundus: firm DVT Evaluation: No evidence of DVT seen on physical exam. Labs: Lab Results  Component Value Date   WBC 20.2 (H) 03/24/2023   HGB 9.0 (L) 03/24/2023   HCT 29.5 (L) 03/24/2023   MCV 72.1 (L) 03/24/2023   PLT 258 03/24/2023      Latest Ref Rng & Units 03/23/2023    4:55 AM  CMP  Glucose 70 - 99 mg/dL 213   BUN 6 - 20 mg/dL 11   Creatinine 0.86 - 1.00 mg/dL 5.78   Sodium 469 - 629 mmol/L 133   Potassium 3.5 - 5.1 mmol/L 3.8   Chloride 98 - 111 mmol/L 96   CO2 22 - 32 mmol/L 26   Calcium 8.9 - 10.3 mg/dL 7.0   Total Protein 6.5 - 8.1 g/dL 5.6   Total Bilirubin 0.0 - 1.2 mg/dL 0.5   Alkaline Phos 38 - 126 U/L 114   AST 15 - 41 U/L  41   ALT 0 - 44 U/L 17    Edinburgh Score:    03/23/2023    9:00 AM  Edinburgh Postnatal Depression Scale Screening Tool  I have been able to laugh and see the funny side of things. 0  I have looked forward with enjoyment to things. 1  I have blamed myself unnecessarily when things went wrong. 2  I have been anxious or worried for no good reason. 0  I have felt scared or panicky for no good reason. 2  Things have been getting on top of me. 0  I have been so unhappy that I have had difficulty sleeping. 0  I have felt sad or miserable. 1  I have been so unhappy that I have been crying. 1  The thought of harming myself has occurred to me. 0  Edinburgh Postnatal Depression Scale Total 7      After visit meds:  Allergies as of 03/24/2023   No Known Allergies      Medication List     TAKE these medications    acetaminophen 325 MG tablet Commonly known as: Tylenol Take  2 tablets (650 mg total) by mouth every 4 (four) hours as needed (for pain scale < 4).   benzocaine-Menthol 20-0.5 % Aero Commonly known as: DERMOPLAST Apply 1 Application topically as needed for irritation (perineal discomfort).   coconut oil Oil Apply 1 Application topically as needed.   dibucaine 1 % Oint Commonly known as: NUPERCAINAL Place 1 Application rectally as needed for hemorrhoids.   ferrous sulfate 325 (65 FE) MG tablet Take 1 tablet (325 mg total) by mouth 2 (two) times daily with a meal.   ibuprofen 600 MG tablet Commonly known as: ADVIL Take 1 tablet (600 mg total) by mouth every 6 (six) hours.   NIFEdipine 30 MG 24 hr tablet Commonly known as: ADALAT CC Take 2 tablets (60 mg total) by mouth daily.   prenatal multivitamin Tabs tablet Take 1 tablet by mouth daily at 12 noon.   senna-docusate 8.6-50 MG tablet Commonly known as: Senokot-S Take 2 tablets by mouth daily.   simethicone 80 MG chewable tablet Commonly known as: MYLICON Chew 1 tablet (80 mg total) by mouth as needed for flatulence.   witch hazel-glycerin pad Commonly known as: TUCKS Apply 1 Application topically as needed for hemorrhoids.        Discharge home in stable condition Infant Feeding: Bottle Infant Disposition:NICU Discharge instruction: per After Visit Summary and Postpartum booklet. Activity: Advance as tolerated. Pelvic rest for 6 weeks.  Diet: routine diet Anticipated Birth Control: Unsure Postpartum Appointment:6 weeks Additional Postpartum F/U: Postpartum Depression checkup Future Appointments: 3 day BP check  Follow up Visit:  Follow-up Information     Haroldine Laws, CNM. Schedule an appointment as soon as possible for a visit on 03/27/2023.   Specialty: Certified Nurse Midwife Why: blood pressure check Contact information: [address] [place] [address] [phone]         Haroldine Laws, CNM. Schedule an appointment as soon as possible for a  visit in 2 week(s).   Specialty: Certified Nurse Midwife Why: postpartum mood check Contact information: [address] [place] [address] [phone]         Haroldine Laws, CNM Follow up in 3 day(s).   Specialty: Certified Nurse Midwife Why: BP Check Contact information: [address] [place] [address] [phone]  03/24/2023 Roney Jaffe, CNM

## 2023-03-24 NOTE — Clinical Social Work Maternal (Signed)
CLINICAL SOCIAL WORK MATERNAL/CHILD NOTE  Patient Details  Name: Tracey Chavez MRN: [id number] Date of Birth: 11/18/2000  Date:  17-Jun-2023  Clinical Social Worker Initiating Note:  Tracey Chavez Date/Time: Initiated:  11-11-23/      Child's Name:  Tracey Chavez   Biological Parents:  Mother, Father   Need for Interpreter:      Reason for Referral:  Current Substance Use/Substance Use During Pregnancy  , Late or No Prenatal Care     Address:  [address] [place] [address]    Phone number:  [phone] (home)     Additional phone number:   Household Members/Support Persons (HM/SP):   Household Member/Support Person 1   HM/SP Name Relationship DOB or Age  HM/SP -1 Tracey Chavez grandmother unknown  HM/SP -2        HM/SP -3        HM/SP -4        HM/SP -5        HM/SP -6        HM/SP -7        HM/SP -8          Natural Supports (not living in the home):      Professional Supports:     Employment:     Type of Work:     Education:      Homebound arranged:    Architect:      Other Resources:  [facility], Food Stamps     Cultural/Religious Considerations Which May Impact Care:    Strengths:  Home prepared for child  , Pediatrician chosen   Psychotropic Medications:         Pediatrician:    [employer]  Pediatrician List:   [facility]  [facility]      Pediatrician Fax Number:    Risk Factors/Current Problems:  Substance Use     Cognitive State:  Alert  , Able to Concentrate     Mood/Affect:      CSW Assessment:  CSW received a consult for MOB and Baby being positive for opiates, no prenatal care.   CSW spoke with RN prior to meeting with MOB.   CSW met with MOB at bedside. Explained CSW's role and reason for referral.  MOB reported she is feeling"alright" post delivery. MOB answered questions appropriately and was engaged in  conversation, however she did not open her eyes and was lying sideways on bed during assessment.   Confirmed contact information for MOB. MOB and Baby will be living with her grandmother Tracey Chavez at discharge.   [facility] Drug Screen/CPS Report Policy. CPS Report made to [facility] CPS [employer] following assessment with MOB, as required by policy. Informed Tracey Chavez of plan for MOB to DC today and Baby to discharge TBD (currently in SCN) and asked to be informed of any barriers to discharge prior to Baby's DC.  MOB reported she is planning to apply for [facility] and Food Stamps. MOB plans to use [facility] for [facility]. MOB reported she has all items needed for Baby. MOB reported she has reliable transportation for herself and Baby, stated she drives. MOB denied resource needs at this time.   MOB reported she has a history of depression. MOB stated she has taken medications for depression in the past, has never seen a counselor/therapist, and is not  currently on any medications. MOB reported she has a good support system and is coping well emotionally at this time. MOB denied SI, HI, or DV. MOB denied the need for mental health support resources at this time, reported she is aware of mental health resources if needed.  CSW provided education and information sheets on PPD and SIDS. MOB verbalized understanding. CSW ecouraged MOB to reach out to her Provider with any questions or needs for support or resources, even after discharge.   MOB denied any needs or questions at this time. CSW encouraged MOB to reach out if any arise prior to discharge.   Please re consult CSW if any additional needs or concerns arise.   CSW Plan/Description:  Psychosocial Support and Ongoing Assessment of Needs, Sudden Infant Death Syndrome (SIDS) Education, Perinatal Mood and Anxiety Disorder (PMADs) Education, Hospital Drug Screen Policy Information, Child Protective Service Report  , CSW  Awaiting CPS Disposition Plan, CSW Will Continue to Monitor Umbilical Cord Tissue Drug Screen Results and Make Report if Tracey Cork, LCSW 05-16-23, 3:05 PM

## 2023-03-24 NOTE — Lactation Note (Signed)
This note was copied from a baby's chart. Lactation Consultation Note  Patient Name: Tracey Chavez [id number] Date: 03/24/2023 Age:25 hours  Lactation stopped by patients room for a follow up assessment.  Upon entry into room, LC asked if it was ok to enter, there was no answer.  LC came around corner to patient waving LC off.  LC left and stated she could return at another time.  LC reported to RN.    Yvette Rack Monti Jilek 03/24/2023, 12:27 PM

## 2023-03-24 NOTE — Progress Notes (Signed)
 Patient discharged. Discharge instructions given. Patient verbalizes understanding. Transported by staff.

## 2023-07-03 ENCOUNTER — Encounter: Payer: Self-pay | Admitting: Emergency Medicine

## 2023-07-03 ENCOUNTER — Other Ambulatory Visit: Payer: Self-pay

## 2023-07-03 ENCOUNTER — Emergency Department
Admission: EM | Admit: 2023-07-03 | Discharge: 2023-07-03 | Disposition: A | Discharge status: Home / Self Care | Attending: Emergency Medicine | Admitting: Emergency Medicine

## 2023-07-03 DIAGNOSIS — F1910 Other psychoactive substance abuse, uncomplicated: Secondary | ICD-10-CM | POA: Insufficient documentation

## 2023-07-03 DIAGNOSIS — R61 Generalized hyperhidrosis: Secondary | ICD-10-CM | POA: Diagnosis not present

## 2023-07-03 DIAGNOSIS — R112 Nausea with vomiting, unspecified: Secondary | ICD-10-CM | POA: Diagnosis present

## 2023-07-03 DIAGNOSIS — R109 Unspecified abdominal pain: Secondary | ICD-10-CM | POA: Insufficient documentation

## 2023-07-03 DIAGNOSIS — R197 Diarrhea, unspecified: Secondary | ICD-10-CM | POA: Insufficient documentation

## 2023-07-03 HISTORY — DX: Other psychoactive substance abuse, uncomplicated: F19.10

## 2023-07-03 LAB — CBC WITH DIFFERENTIAL/PLATELET
Abs Immature Granulocytes: 0.02 10*3/uL (ref 0.00–0.07)
Basophils Absolute: 0 10*3/uL (ref 0.0–0.1)
Basophils Relative: 0 %
Eosinophils Absolute: 0.1 10*3/uL (ref 0.0–0.5)
Eosinophils Relative: 1 %
HCT: 38.2 % (ref 36.0–46.0)
Hemoglobin: 11.7 g/dL — ABNORMAL LOW (ref 12.0–15.0)
Immature Granulocytes: 0 %
Lymphocytes Relative: 25 %
Lymphs Abs: 1.8 10*3/uL (ref 0.7–4.0)
MCH: 23.3 pg — ABNORMAL LOW (ref 26.0–34.0)
MCHC: 30.6 g/dL (ref 30.0–36.0)
MCV: 75.9 fL — ABNORMAL LOW (ref 80.0–100.0)
Monocytes Absolute: 0.4 10*3/uL (ref 0.1–1.0)
Monocytes Relative: 5 %
Neutro Abs: 5 10*3/uL (ref 1.7–7.7)
Neutrophils Relative %: 69 %
Platelets: 396 10*3/uL (ref 150–400)
RBC: 5.03 MIL/uL (ref 3.87–5.11)
RDW: 14.6 % (ref 11.5–15.5)
WBC: 7.2 10*3/uL (ref 4.0–10.5)
nRBC: 0 % (ref 0.0–0.2)

## 2023-07-03 LAB — COMPREHENSIVE METABOLIC PANEL WITH GFR
ALT: 27 U/L (ref 0–44)
AST: 26 U/L (ref 15–41)
Albumin: 4.6 g/dL (ref 3.5–5.0)
Alkaline Phosphatase: 60 U/L (ref 38–126)
Anion gap: 9 (ref 5–15)
BUN: 8 mg/dL (ref 6–20)
CO2: 24 mmol/L (ref 22–32)
Calcium: 9.6 mg/dL (ref 8.9–10.3)
Chloride: 104 mmol/L (ref 98–111)
Creatinine, Ser: 0.97 mg/dL (ref 0.44–1.00)
GFR, Estimated: >60 mL/min (ref >60)
Glucose, Bld: 101 mg/dL — ABNORMAL HIGH (ref 70–99)
Potassium: 3.5 mmol/L (ref 3.5–5.1)
Sodium: 137 mmol/L (ref 135–145)
Total Bilirubin: 1 mg/dL (ref 0.0–1.2)
Total Protein: 7.7 g/dL (ref 6.5–8.1)

## 2023-07-03 MED ORDER — ONDANSETRON HCL 4 MG PO TABS: 4.0000 mg | ORAL_TABLET | Freq: Three times a day (TID) | ORAL | 0 refills | Status: AC | PRN

## 2023-07-03 MED ORDER — SODIUM CHLORIDE 0.9 % IV BOLUS
1000.0000 mL | Freq: Once | INTRAVENOUS | Status: AC
  Administered 2023-07-03: 1000 mL via INTRAVENOUS

## 2023-07-03 MED ORDER — ONDANSETRON HCL 4 MG/2ML IJ SOLN
4.0000 mg | Freq: Once | INTRAMUSCULAR | Status: AC
  Administered 2023-07-03: 4 mg via INTRAVENOUS
  Filled 2023-07-03: qty 2

## 2023-07-03 NOTE — Discharge Instructions (Signed)
 Please schedule follow-up appointment with substance abuse specialist listed.  Return here for new or worse symptoms.

## 2023-07-03 NOTE — ED Triage Notes (Signed)
 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

## 2023-07-03 NOTE — ED Provider Notes (Signed)
 [place] EMERGENCY DEPARTMENT AT [facility] Provider Note   CSN: [id number] Arrival date & time: 07/03/23  8469     History  Chief Complaint  Patient presents with   Emesis         Tracey Chavez is a 23 y.o. female.  Patient is a 23 year old female who presents today from home complaining of 1 week of nausea vomiting night sweats and loose stools which have recently gotten worse the last 3 days. she stated that she discontinued snorting opioids 1 week ago and symptoms started shortly after that she did have 1 spontaneous vaginal delivery 3 months ago she is not currently breast-feeding but is still lactating.   Emesis Associated symptoms: abdominal pain and diarrhea   Associated symptoms: no chills, no cough and no fever        Home Medications Prior to Admission medications   Medication Sig Start Date End Date Taking? Authorizing Provider  ondansetron  (ZOFRAN ) 4 MG tablet Take 1 tablet (4 mg total) by mouth every 8 (eight) hours as needed for nausea or vomiting. 07/03/23  Yes Hollie Luria, PA-C  acetaminophen  (TYLENOL ) 325 MG tablet Take 2 tablets (650 mg total) by mouth every 4 (four) hours as needed (for pain scale < 4). 03/23/23   Arzella Laurence, CNM  benzocaine -Menthol  (DERMOPLAST) 20-0.5 % AERO Apply 1 Application topically as needed for irritation (perineal discomfort). 03/23/23   Dickerson, Felicia, CNM  coconut oil OIL Apply 1 Application topically as needed. 03/23/23   Dickerson, Felicia, CNM  dibucaine (NUPERCAINAL) 1 % OINT Place 1 Application rectally as needed for hemorrhoids. 03/23/23   Arzella Laurence, CNM  ferrous sulfate  325 (65 FE) MG tablet Take 1 tablet (325 mg total) by mouth 2 (two) times daily with a meal. 03/24/23   Arzella Laurence, CNM  ibuprofen  (ADVIL ) 600 MG tablet Take 1 tablet (600 mg total) by mouth every 6 (six) hours. 03/23/23   Dickerson, Felicia, CNM  NIFEdipine  (ADALAT  CC) 30 MG 24 hr tablet Take 2 tablets (60 mg total) by  mouth daily. 03/24/23 04/23/23  Liboon, Jazmine, CNM  Prenatal Vit-Fe Fumarate-FA (PRENATAL MULTIVITAMIN) TABS tablet Take 1 tablet by mouth daily at 12 noon.    [provider]  senna-docusate (SENOKOT-S) 8.6-50 MG tablet Take 2 tablets by mouth daily. 03/24/23   Dickerson, Felicia, CNM  simethicone  (MYLICON) 80 MG chewable tablet Chew 1 tablet (80 mg total) by mouth as needed for flatulence. 03/23/23   Arzella Laurence, CNM  witch hazel-glycerin  (TUCKS) pad Apply 1 Application topically as needed for hemorrhoids. 03/23/23   Dickerson, Felicia, CNM      Allergies    Patient has no known allergies.    Review of Systems   Review of Systems  Constitutional:  Negative for chills and fever.  HENT:  Negative for congestion.   Respiratory:  Negative for cough and shortness of breath.   Cardiovascular:  Negative for chest pain.  Gastrointestinal:  Positive for abdominal pain, diarrhea, nausea and vomiting.  Genitourinary:  Negative for dysuria.  Neurological:  Negative for weakness and numbness.    Physical Exam Updated Vital Signs BP (!) 137/91 (BP Location: Right Arm)   Pulse 80   Temp 98.1 F (36.7 C) (Oral)   Resp 18   Ht 5\' 1"  (1.549 m)   Wt 52 kg   SpO2 100%   BMI 21.66 kg/m  Physical Exam Vitals reviewed.  Constitutional:      Appearance: Normal appearance.  HENT:  Head: Normocephalic and atraumatic.     Nose: Nose normal.  Cardiovascular:     Pulses: Normal pulses.  Pulmonary:     Effort: Pulmonary effort is normal.  Abdominal:     Tenderness: There is no abdominal tenderness.  Musculoskeletal:     Cervical back: Normal range of motion.  Neurological:     Mental Status: She is alert and oriented to person, place, and time. Mental status is at baseline.  Psychiatric:        Mood and Affect: Mood normal.        Behavior: Behavior normal.     ED Results / Procedures / Treatments   Labs (all labs ordered are listed, but only abnormal results are  displayed) Labs Reviewed  COMPREHENSIVE METABOLIC PANEL WITH GFR - Abnormal; Notable for the following components:      Result Value   Glucose, Bld 101 (*)    All other components within normal limits  CBC WITH DIFFERENTIAL/PLATELET - Abnormal; Notable for the following components:   Hemoglobin 11.7 (*)    MCV 75.9 (*)    MCH 23.3 (*)    All other components within normal limits  PREGNANCY, URINE    EKG None  Radiology No results found.  Procedures Procedures    Medications Ordered in ED Medications  sodium chloride  0.9 % bolus 1,000 mL (0 mLs Intravenous Stopped 07/03/23 1029)  ondansetron  (ZOFRAN ) injection 4 mg (4 mg Intravenous Given 07/03/23 0935)    ED Course/ Medical Decision Making/ A&P                                 Medical Decision Making Patient here for NVD after discontinuing opioid use 1 week ago.  She is overall well-appearing on physical exam.  No acute abdominal tenderness low suspicion of intra-abdominal pathology.  Will check labs medicate and reevaluate.  CBC and CMP are overall unremarkable.  No leukocytosis no significant anemia no metabolic derangements.  Patient did have a initial mild hypothermia however on recheck it was 98.1.  Patient became irritated with the wait time she is feeling better but wants to leave.  Repeat abdominal exam is still benign.  I did offer her outpatient resources she was agreeable.  She does appear stable for discharge and outpatient follow-up.  Given return precautions.  Amount and/or Complexity of Data Reviewed Labs: ordered.  Risk Prescription drug management.           Final Clinical Impression(s) / ED Diagnoses Final diagnoses:  Nausea vomiting and diarrhea  Substance abuse (HCC)    Rx / DC Orders ED Discharge Orders          Ordered    ondansetron  (ZOFRAN ) 4 MG tablet  Every 8 hours PRN        07/03/23 1031              Hollie Luria, PA-C 07/03/23 1034    Viviano Ground, MD 07/03/23  1520

## 2023-07-03 NOTE — ED Notes (Addendum)
 Pt came out of the room  She had disconnected her IV fluids  and requesting the IV be taken out  She is wanting to leave  Provider aware

## 2023-09-26 ENCOUNTER — Emergency Department: Payer: MEDICAID

## 2023-09-26 ENCOUNTER — Emergency Department
Admission: EM | Admit: 2023-09-26 | Discharge: 2023-09-27 | Disposition: A | Discharge status: Other Acute Inpatient Hospital | Payer: MEDICAID | Attending: Emergency Medicine | Admitting: Emergency Medicine

## 2023-09-26 ENCOUNTER — Encounter: Payer: Self-pay | Admitting: Emergency Medicine

## 2023-09-26 ENCOUNTER — Other Ambulatory Visit: Payer: Self-pay

## 2023-09-26 DIAGNOSIS — F29 Unspecified psychosis not due to a substance or known physiological condition: Secondary | ICD-10-CM | POA: Insufficient documentation

## 2023-09-26 DIAGNOSIS — R4182 Altered mental status, unspecified: Secondary | ICD-10-CM | POA: Insufficient documentation

## 2023-09-26 DIAGNOSIS — F1910 Other psychoactive substance abuse, uncomplicated: Secondary | ICD-10-CM

## 2023-09-26 DIAGNOSIS — F1390 Sedative, hypnotic, or anxiolytic use, unspecified, uncomplicated: Secondary | ICD-10-CM | POA: Diagnosis not present

## 2023-09-26 DIAGNOSIS — F1914 Other psychoactive substance abuse with psychoactive substance-induced mood disorder: Secondary | ICD-10-CM | POA: Diagnosis present

## 2023-09-26 DIAGNOSIS — R443 Hallucinations, unspecified: Secondary | ICD-10-CM | POA: Insufficient documentation

## 2023-09-26 LAB — URINE DRUG SCREEN, QUALITATIVE (ARMC ONLY)
Amphetamines, Ur Screen: NOT DETECTED
Barbiturates, Ur Screen: NOT DETECTED
Benzodiazepine, Ur Scrn: POSITIVE — AB
Cannabinoid 50 Ng, Ur ~~LOC~~: POSITIVE — AB
Cocaine Metabolite,Ur ~~LOC~~: NOT DETECTED
MDMA (Ecstasy)Ur Screen: NOT DETECTED
Methadone Scn, Ur: NOT DETECTED
Opiate, Ur Screen: NOT DETECTED
Phencyclidine (PCP) Ur S: NOT DETECTED
Tricyclic, Ur Screen: POSITIVE — AB

## 2023-09-26 LAB — CBC WITH DIFFERENTIAL/PLATELET
Abs Immature Granulocytes: 0.03 K/uL (ref 0.00–0.07)
Basophils Absolute: 0 K/uL (ref 0.0–0.1)
Basophils Relative: 0 %
Eosinophils Absolute: 0 K/uL (ref 0.0–0.5)
Eosinophils Relative: 0 %
HCT: 37.2 % (ref 36.0–46.0)
Hemoglobin: 11.5 g/dL — ABNORMAL LOW (ref 12.0–15.0)
Immature Granulocytes: 0 %
Lymphocytes Relative: 18 %
Lymphs Abs: 1.7 K/uL (ref 0.7–4.0)
MCH: 23.3 pg — ABNORMAL LOW (ref 26.0–34.0)
MCHC: 30.9 g/dL (ref 30.0–36.0)
MCV: 75.3 fL — ABNORMAL LOW (ref 80.0–100.0)
Monocytes Absolute: 0.6 K/uL (ref 0.1–1.0)
Monocytes Relative: 7 %
Neutro Abs: 7 K/uL (ref 1.7–7.7)
Neutrophils Relative %: 75 %
Platelets: 317 K/uL (ref 150–400)
RBC: 4.94 MIL/uL (ref 3.87–5.11)
RDW: 14 % (ref 11.5–15.5)
WBC: 9.3 K/uL (ref 4.0–10.5)
nRBC: 0 % (ref 0.0–0.2)

## 2023-09-26 LAB — SALICYLATE LEVEL: Salicylate Lvl: <7 mg/dL — ABNORMAL LOW (ref 7.0–30.0)

## 2023-09-26 LAB — COMPREHENSIVE METABOLIC PANEL WITH GFR
ALT: 19 U/L (ref 0–44)
AST: 22 U/L (ref 15–41)
Albumin: 4.7 g/dL (ref 3.5–5.0)
Alkaline Phosphatase: 50 U/L (ref 38–126)
Anion gap: 12 (ref 5–15)
BUN: 16 mg/dL (ref 6–20)
CO2: 23 mmol/L (ref 22–32)
Calcium: 9.8 mg/dL (ref 8.9–10.3)
Chloride: 101 mmol/L (ref 98–111)
Creatinine, Ser: 1.08 mg/dL — ABNORMAL HIGH (ref 0.44–1.00)
GFR, Estimated: >60 mL/min (ref >60)
Glucose, Bld: 103 mg/dL — ABNORMAL HIGH (ref 70–99)
Potassium: 3.7 mmol/L (ref 3.5–5.1)
Sodium: 136 mmol/L (ref 135–145)
Total Bilirubin: 1.1 mg/dL (ref 0.0–1.2)
Total Protein: 8.1 g/dL (ref 6.5–8.1)

## 2023-09-26 LAB — ACETAMINOPHEN LEVEL: Acetaminophen (Tylenol), Serum: <10 ug/mL — ABNORMAL LOW (ref 10–30)

## 2023-09-26 LAB — PHOSPHORUS: Phosphorus: 3.2 mg/dL (ref 2.5–4.6)

## 2023-09-26 LAB — TROPONIN I (HIGH SENSITIVITY): Troponin I (High Sensitivity): 5 ng/L (ref <18)

## 2023-09-26 LAB — ETHANOL: Alcohol, Ethyl (B): <15 mg/dL (ref <15)

## 2023-09-26 LAB — MAGNESIUM: Magnesium: 1.8 mg/dL (ref 1.7–2.4)

## 2023-09-26 LAB — POC URINE PREG, ED: Preg Test, Ur: NEGATIVE

## 2023-09-26 LAB — HCG, QUANTITATIVE, PREGNANCY: hCG, Beta Chain, Quant, S: 1 m[IU]/mL (ref <5)

## 2023-09-26 MED ORDER — ZIPRASIDONE MESYLATE 20 MG IM SOLR
20.0000 mg | Freq: Once | INTRAMUSCULAR | Status: AC
  Administered 2023-09-26: 20 mg via INTRAMUSCULAR
  Filled 2023-09-26: qty 20

## 2023-09-26 MED ORDER — MIDAZOLAM 5 MG/ML ADULT INJ FOR INTRANASAL USE (MC USE ONLY)
5.0000 mg | Freq: Once | INTRAMUSCULAR | Status: AC
  Administered 2023-09-26: 5 mg via NASAL
  Filled 2023-09-26 (×2): qty 2

## 2023-09-26 MED ORDER — OLANZAPINE 10 MG IM SOLR
5.0000 mg | Freq: Once | INTRAMUSCULAR | Status: DC
  Filled 2023-09-26: qty 10

## 2023-09-26 NOTE — ED Notes (Signed)
 2 necklaces 1 brown bodysuit 1 pair of gray leggings 1 pink bra 1 pair of black and white crocs

## 2023-09-26 NOTE — ED Triage Notes (Signed)
 Patient to ED via POV with family for acid use since last Tuesday. Family reports pt having hallucinations and violent with family. Pt noted to be stumbling in ED lobby. PT alert to self but slurred speech noted and drooling.
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
## 2023-09-26 NOTE — ED Notes (Signed)
IVC pending consult   

## 2023-09-26 NOTE — BH Assessment (Addendum)
 TTS attempted to assess pt. Pt was difficult to wake up. Dorian, RN informed TTS that pt was given Geodon  medication @1945 .

## 2023-09-26 NOTE — ED Notes (Signed)
 TTS at bedside with telepsych. Attempted assessment but pt sleeping.

## 2023-09-26 NOTE — ED Notes (Signed)
 Patient transported to CT
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
## 2023-09-26 NOTE — BH Assessment (Signed)
 Psych Team attempted to interview patient. Patient was given medications to relax due to current presentation. Patient is sleeping. Will try back at later time.

## 2023-09-27 DIAGNOSIS — F1320 Sedative, hypnotic or anxiolytic dependence, uncomplicated: Secondary | ICD-10-CM

## 2023-09-27 DIAGNOSIS — F1994 Other psychoactive substance use, unspecified with psychoactive substance-induced mood disorder: Secondary | ICD-10-CM

## 2023-09-27 DIAGNOSIS — F1990 Other psychoactive substance use, unspecified, uncomplicated: Secondary | ICD-10-CM

## 2023-09-27 MED ORDER — LORAZEPAM 1 MG PO TABS
1.0000 mg | ORAL_TABLET | Freq: Four times a day (QID) | ORAL | Status: DC | PRN
  Administered 2023-09-27: 1 mg via ORAL
  Filled 2023-09-27: qty 1

## 2023-09-27 NOTE — ED Notes (Signed)
 Pt transferring to [facility] for in-patient admission. Admission discussed with pt and she verbalized understanding. Stable, ambulatory and in NAD.

## 2023-09-27 NOTE — BH Assessment (Signed)
 Comprehensive Clinical Assessment (CCA) Note  09/27/2023 Tracey Chavez [id number]  Chief Complaint:  Chief Complaint  Patient presents with   Drug Problem   Visit Diagnosis: Hallucinations   [name] is a 23 year old female who presents to the ER due to recent changes in her behaviors and family believe it was due in part of drug use. Patient has a history of anxiety and depression. She was inpatient over a year ago for depression. During the interview, the patient admits to the use of illicit substance, history of depression and anxiety. She also shared, recent family stressors but didn't want to go in to details. While saying this, she became tearful and upset. She reports of having a history of V/H and denies A/H. While talking, her speech was pressure and her speech was rapid.  CCA Screening, Triage and Referral (STR)  Patient Reported Information How did you hear about us ? Family/Friend  What Is the Reason for Your Visit/Call Today? Patient brought to the ER due to changes in her behaviors and family believe it was due to drug use.  How Long Has This Been Causing You Problems? 1 wk - 1 month  What Do You Feel Would Help You the Most Today? Alcohol or Drug Use Treatment; Treatment for Depression or other mood problem   Have You Recently Had Any Thoughts About Hurting Yourself? No  Are You Planning to Commit Suicide/Harm Yourself At This time? No   Flowsheet Row ED from 09/26/2023 in [facility] Department at [facility] ED from 07/03/2023 in [facility] Emergency Department at [facility] ED to Hosp-Admission (Discharged) from 03/21/2023 in [facility]  C-SSRS RISK CATEGORY No Risk No Risk No Risk    Have you Recently Had Thoughts About Hurting Someone [place]? No  Are You Planning to Harm Someone at This Time? No  Explanation: No data recorded  Have You Used Any Alcohol or Drugs in the Past 24 Hours? Yes  How  Long Ago Did You Use Drugs or Alcohol? Several years  What Did You Use and How Much? THC and unsure what was added to it.   Do You Currently Have a Therapist/Psychiatrist? No  Name of Therapist/Psychiatrist:    Have You Been Recently Discharged From Any Office Practice or Programs? No  Explanation of Discharge From Practice/Program: No data recorded    CCA Screening Triage Referral Assessment Type of Contact: Face-to-Face  Telemedicine Service Delivery:   Is this Initial or Reassessment?   Date Telepsych consult ordered in [facility]:    Time Telepsych consult ordered in [facility]:    Location of Assessment: [facility] ED  Provider Location: [facility] ED   Collateral Involvement: No data recorded  Does Patient Have a Court Appointed Legal Guardian? No  Legal Guardian Contact Information: No data recorded Copy of Legal Guardianship Form: No data recorded Legal Guardian Notified of Arrival: No data recorded Legal Guardian Notified of Pending Discharge: No data recorded If Minor and Not Living with Parent(s), Who has Custody? No data recorded Is CPS involved or ever been involved? Never  Is APS involved or ever been involved? Never   Patient Determined To Be At Risk for Harm To Self or Others Based on Review of Patient Reported Information or Presenting Complaint? No  Method: No data recorded Availability of Means: No data recorded Intent: No data recorded Notification Required: No data recorded Additional Information for Danger to Others Potential: No data recorded Additional Comments for Danger to Others  Potential: No data recorded Are There Guns or Other Weapons in Your Home? No  Types of Guns/Weapons: No data recorded Are These Weapons Safely Secured?                            No  Who Could Verify You Are Able To Have These Secured: No data recorded Do You Have any Outstanding Charges, Pending Court Dates, Parole/Probation? No data recorded Contacted To Inform of Risk of Harm To Self  or Others: No data recorded   Does Patient Present under Involuntary Commitment? No data recorded   [place] of Residence: Man   Patient Currently Receiving the Following Services: Not Receiving Services   Determination of Need: Emergent (2 hours)   Options For Referral: ED Visit     CCA Biopsychosocial Patient Reported Schizophrenia/Schizoaffective Diagnosis in Past: No data recorded  Strengths: Stable housing, some insight, and polite   Mental Health Symptoms Depression:  Change in energy/activity; Difficulty Concentrating   Duration of Depressive symptoms:    Mania:  Change in energy/activity; Increased Energy   Anxiety:   Difficulty concentrating; Restlessness; Tension; Worrying   Psychosis:  Hallucinations   Duration of Psychotic symptoms: Duration of Psychotic Symptoms: N/A   Trauma:  N/A   Obsessions:  N/A   Compulsions:  N/A   Inattention:  N/A   Hyperactivity/Impulsivity:  N/A   Oppositional/Defiant Behaviors:  N/A   Emotional Irregularity:  N/A   Other Mood/Personality Symptoms:  No data recorded   Mental Status Exam Appearance and self-care  Stature:  Small   Weight:  Thin   Clothing:  Neat/clean; Age-appropriate   Grooming:  Normal   Cosmetic use:  None   Posture/gait:  Normal   Motor activity:  -- (Within normal range)   Sensorium  Attention:  Distractible   Concentration:  Scattered   Orientation:  X5   Recall/memory:  Defective in Recent   Affect and Mood  Affect:  Anxious   Mood:  Anxious   Relating  Eye contact:  Fleeting   Facial expression:  Anxious; Tense   Attitude toward examiner:  Cooperative   Thought and Language  Speech flow: Articulation error   Thought content:  Appropriate to Mood and Circumstances   Preoccupation:  None   Hallucinations:  Visual   Organization:  Loose   Company secretary of Knowledge:  Fair   Intelligence:  Average   Abstraction:  Concrete   Judgement:   Impaired   Reality Testing:  Distorted   Insight:  Fair   Decision Making:  Impulsive   Social Functioning  Social Maturity:  Impulsive   Social Judgement:  Impropriety   Stress  Stressors:  Other (Comment)   Coping Ability:  Exhausted; Overwhelmed   Skill Deficits:  None   Supports:  Family     Religion: Religion/Spirituality Are You A Religious Person?: No  Leisure/Recreation: Leisure / Recreation Do You Have Hobbies?: No  Exercise/Diet: Exercise/Diet Do You Exercise?: No Have You Gained or Lost A Significant Amount of Weight in the Past Six Months?: No Do You Follow a Special Diet?: No Do You Have Any Trouble Sleeping?: Yes Explanation of Sleeping Difficulties: Ongoing problems with sleep   CCA Employment/Education Employment/Work Situation: Employment / Work Situation Employment Situation: Unemployed Patient's Job has Been Impacted by Current Illness: No Has Patient ever Been in Equities trader?: No  Education: Education Is Patient Currently Attending School?: No Did Theme park manager?: No  Did You Have An Individualized Education Program (IIEP): No Did You Have Any Difficulty At School?: No Patient's Education Has Been Impacted by Current Illness: No   CCA Family/Childhood History Family and Relationship History: Family history Marital status: Single Does patient have children?: Yes How many children?: 1 How is patient's relationship with their children?: New born, approximately 72 old.  Childhood History:  Childhood History By whom was/is the patient raised?: Mother Did patient suffer any verbal/emotional/physical/sexual abuse as a child?: No Did patient suffer from severe childhood neglect?: No Has patient ever been sexually abused/assaulted/raped as an adolescent or adult?: No Was the patient ever a victim of a crime or a disaster?: No Witnessed domestic violence?: No Has patient been affected by domestic violence as an adult?:  No   CCA Substance Use Alcohol/Drug Use: Alcohol / Drug Use Pain Medications: See MAR Prescriptions: See MAR Over the Counter: See MAR History of alcohol / drug use?: Yes Longest period of sobriety (when/how long): When she was pregnant. Substance #1 Name of Substance 1: Cannabis Substance #2 Name of Substance 2: Cocaine    ASAM's:  Six Dimensions of Multidimensional Assessment  Dimension 1:  Acute Intoxication and/or Withdrawal Potential:      Dimension 2:  Biomedical Conditions and Complications:      Dimension 3:  Emotional, Behavioral, or Cognitive Conditions and Complications:     Dimension 4:  Readiness to Change:     Dimension 5:  Relapse, Continued use, or Continued Problem Potential:     Dimension 6:  Recovery/Living Environment:     ASAM Severity Score:    ASAM Recommended Level of Treatment:     Substance use Disorder (SUD)    Recommendations for Services/Supports/Treatments:    Disposition Recommendation per psychiatric provider: Inpatient   DSM5 Diagnoses: Patient Active Problem List   Diagnosis Date Noted   Precipitate labor, with delivery 03/21/2023   Edema during pregnancy 01/05/2023   No prenatal care in current pregnancy in third trimester 01/05/2023   MDD (major depressive disorder), severe (HCC) 07/21/2017   Overdose of nonsteroidal anti-inflammatory drug (NSAID) 07/20/2017   Severe major depression, single episode, without psychotic features (HCC) 07/20/2017   Cannabis abuse 07/20/2017     Referrals to Alternative Service(s): Referred to Alternative Service(s):   Place:   Date:   Time:    Referred to Alternative Service(s):   Place:   Date:   Time:    Referred to Alternative Service(s):   Place:   Date:   Time:    Referred to Alternative Service(s):   Place:   Date:   Time:     Kiki DOROTHA Barge MS, LCAS, Ms [facility] Therapeutic Triage Specialist 09/27/2023 12:22 PM

## 2023-09-27 NOTE — ED Notes (Signed)
 Lunch tray provided to pt.

## 2023-09-27 NOTE — ED Notes (Signed)
[facility]  [facility]  DEPT  CALLED  FOR  TRANSPORT TO  [facility] 

## 2023-09-27 NOTE — ED Notes (Signed)
 Pt Breakfast provided at bedside

## 2023-09-27 NOTE — ED Notes (Signed)
 Left vm with call back number for report to [facility].

## 2023-09-27 NOTE — ED Notes (Signed)
Snacks given to pt.

## 2023-09-27 NOTE — ED Provider Notes (Signed)
 Emergency Medicine Observation Re-evaluation Note  PRESLYNN BIER is a 23 y.o. female, seen on rounds today.  Pt initially presented to the ED for complaints of Drug Problem  Currently, the patient is no acute distress. No issues   Physical Exam  Blood pressure (!) 127/92, pulse 90, temperature 98.1 F (36.7 C), temperature source Oral, resp. rate 14, height 5' 1 (1.549 m), weight 54.4 kg, SpO2 100%, unknown if currently breastfeeding.  Physical Exam General: No apparent distress Pulm: Normal WOB Psych: resting     ED Course / MDM     I have reviewed the labs performed to date as well as medications administered while in observation.  Recent changes in the last 24 hours include none   Plan   Current plan is to continue to wait for psych plan/placement if felt warranted  Patient is under full IVC at this time.   Ernest Ronal BRAVO, MD 09/27/23 0500

## 2023-09-27 NOTE — ED Notes (Signed)
 Pt awake at this time and asking for her phone. Explained to pt due to hospital policy she couldn't have it. Pt cooperative and laid back down.

## 2023-09-27 NOTE — Progress Notes (Signed)
 Per [facility],  BICU is at capacity.  Patient has been referred to the following facilities:   Service Provider Phone  [facility]  [phone]  [facility]  [phone]  [facility]  [phone]  [facility]  [phone]  [facility]  [phone]  [facility]  [phone]  [facility]  [phone]  [facility]  [phone]  [facility]  [phone]  [facility]  [phone]  [facility]  [phone]  [facility]  [phone]  [facility]  [phone]  [facility]  [phone]  [facility]  [phone]  [facility]  [phone], [place] [phone]

## 2023-09-27 NOTE — BH Assessment (Signed)
 Writer called and left a HIPPA Compliant message with mother Alston [phone]), requesting a return phone call. The number on the IVC petitioned was non-working number [phone]).

## 2023-09-27 NOTE — ED Notes (Signed)
IVC/Pending consult °

## 2023-09-27 NOTE — ED Notes (Signed)
 EMTALA reviewed by this RN. ED secretary informed vital signs need to be updated and then form can be printed.
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
## 2023-09-27 NOTE — Progress Notes (Signed)
 Patient has been accepted to [facility] for 09/27/23. Patient was assigned to [facility]. Accepting physician is Dr. Millie Manners. Call report to [phone] Option 2 (Please leave voicemail). Representative was [employer].   ER Staff is aware of it: Olam, ER Secretary Dr. Waymond, ER MD Naomie PEAK, Patient's Nurse  Address:  [address]                  [place], [place] [address]
